# Patient Record
Sex: Female | Born: 1980 | Race: White | Hispanic: No | Marital: Married | Smoking: Never smoker
Health system: Southern US, Community
[De-identification: ages and names within clinical notes are randomized; demographics above are authoritative.]

## PROBLEM LIST (undated history)

## (undated) DIAGNOSIS — G43909 Migraine, unspecified, not intractable, without status migrainosus: Secondary | ICD-10-CM

## (undated) DIAGNOSIS — I10 Essential (primary) hypertension: Secondary | ICD-10-CM

## (undated) DIAGNOSIS — F419 Anxiety disorder, unspecified: Secondary | ICD-10-CM

## (undated) DIAGNOSIS — Z8661 Personal history of infections of the central nervous system: Secondary | ICD-10-CM

## (undated) DIAGNOSIS — F329 Major depressive disorder, single episode, unspecified: Secondary | ICD-10-CM

## (undated) DIAGNOSIS — N879 Dysplasia of cervix uteri, unspecified: Secondary | ICD-10-CM

## (undated) DIAGNOSIS — F32A Depression, unspecified: Secondary | ICD-10-CM

## (undated) DIAGNOSIS — J111 Influenza due to unidentified influenza virus with other respiratory manifestations: Secondary | ICD-10-CM

## (undated) DIAGNOSIS — R87629 Unspecified abnormal cytological findings in specimens from vagina: Secondary | ICD-10-CM

## (undated) HISTORY — DX: Dysplasia of cervix uteri, unspecified: N87.9

## (undated) HISTORY — DX: Depression, unspecified: F32.A

## (undated) HISTORY — PX: TONSILLECTOMY: SHX5217

## (undated) HISTORY — DX: Personal history of infections of the central nervous system: Z86.61

## (undated) HISTORY — DX: Influenza due to unidentified influenza virus with other respiratory manifestations: J11.1

## (undated) HISTORY — DX: Migraine, unspecified, not intractable, without status migrainosus: G43.909

## (undated) HISTORY — DX: Anxiety disorder, unspecified: F41.9

## (undated) HISTORY — DX: Unspecified abnormal cytological findings in specimens from vagina: R87.629

## (undated) HISTORY — PX: VARICOSE VEIN SURGERY: SHX832

## (undated) HISTORY — DX: Major depressive disorder, single episode, unspecified: F32.9

## (undated) HISTORY — DX: Essential (primary) hypertension: I10

---

## 2002-07-03 ENCOUNTER — Other Ambulatory Visit: Admission: RE | Admit: 2002-07-03 | Discharge: 2002-07-03 | Payer: Self-pay | Admitting: Obstetrics and Gynecology

## 2005-10-27 ENCOUNTER — Emergency Department: Payer: Self-pay | Admitting: Emergency Medicine

## 2005-11-03 ENCOUNTER — Ambulatory Visit: Payer: Self-pay | Admitting: Family Medicine

## 2007-02-25 IMAGING — CT CT HEAD WITHOUT CONTRAST
2 series · 16 of 30 positions shown, 20 images · non-contrast
Comparison: none

REASON FOR EXAM: Hypertension
COMMENTS:

[Series 2: without · axial · non-contrast · 0.40mm/px · z∈[-180,-56]mm · 13 of 31 slices shown, 17 images]
[im 3/31  brain]
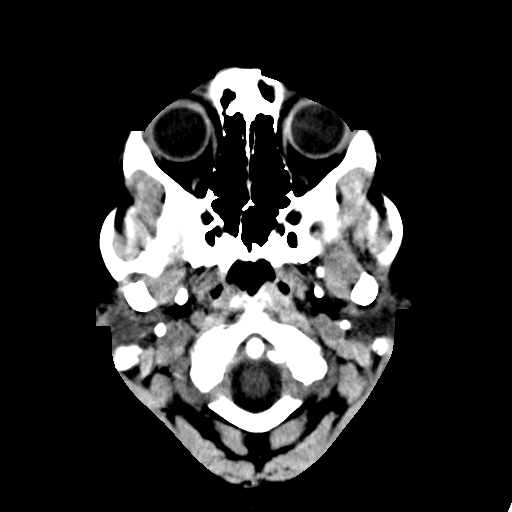
[im 3/31  bone]
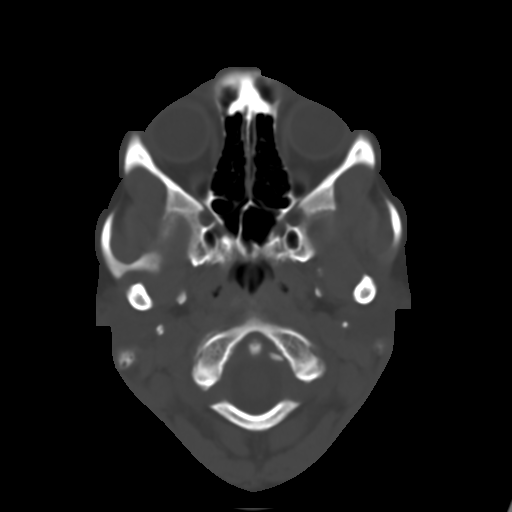
[im 5/31  brain]
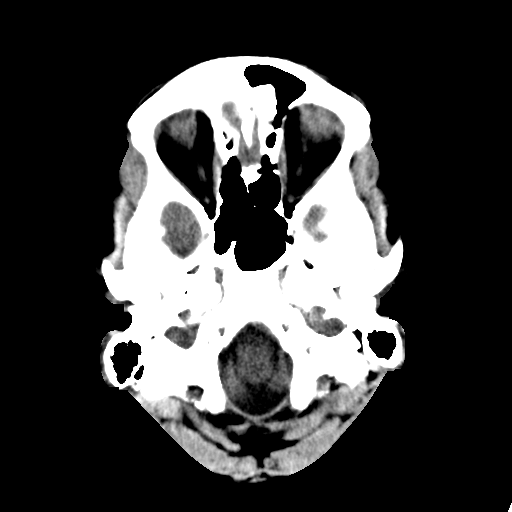
[im 7/31  brain]
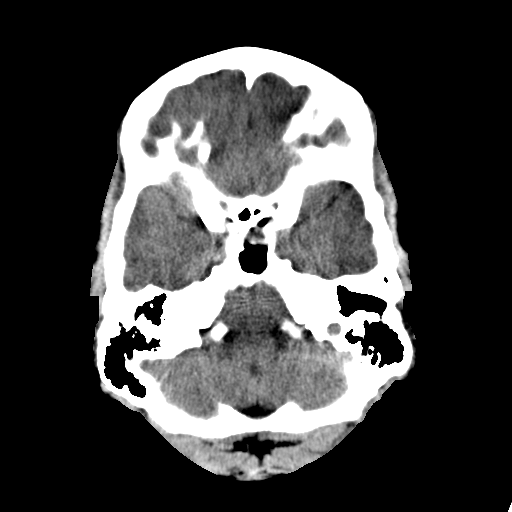
[im 9/31  brain]
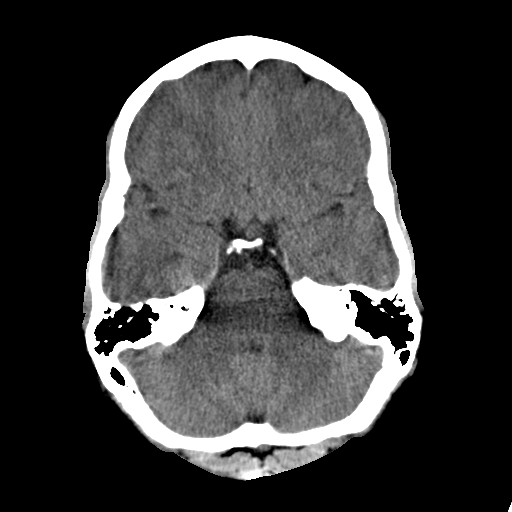
[im 11/31  brain]
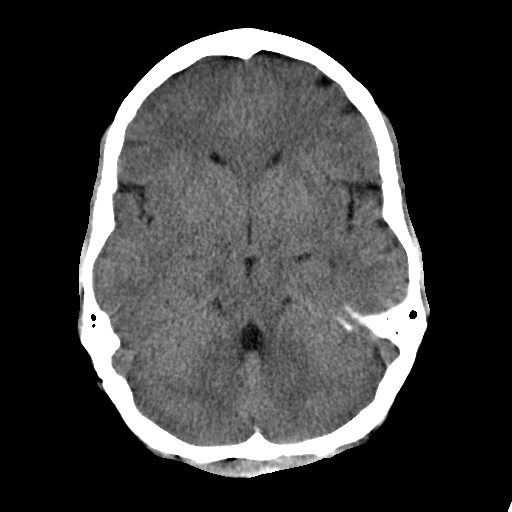
[im 11/31  bone]
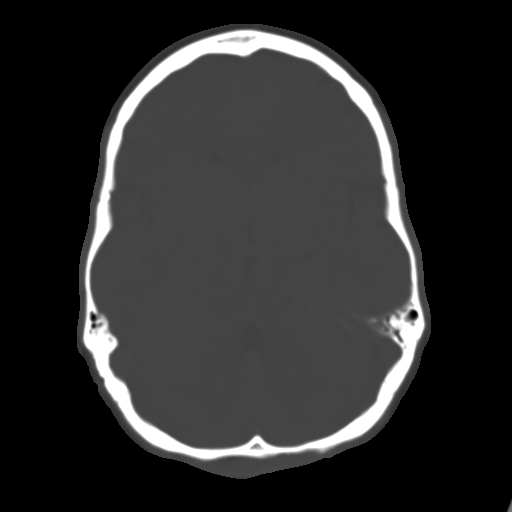
[im 13/31  brain]
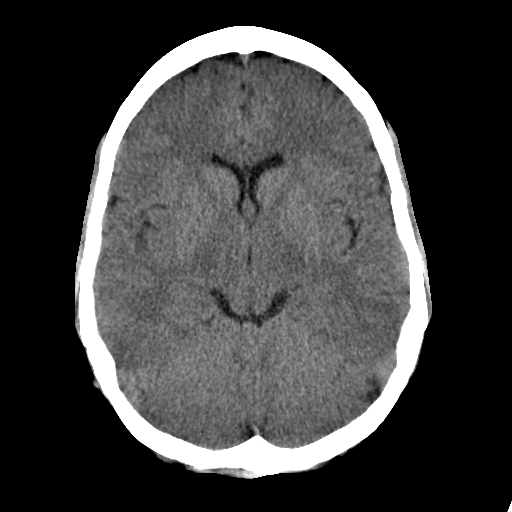
[im 16/31  brain]
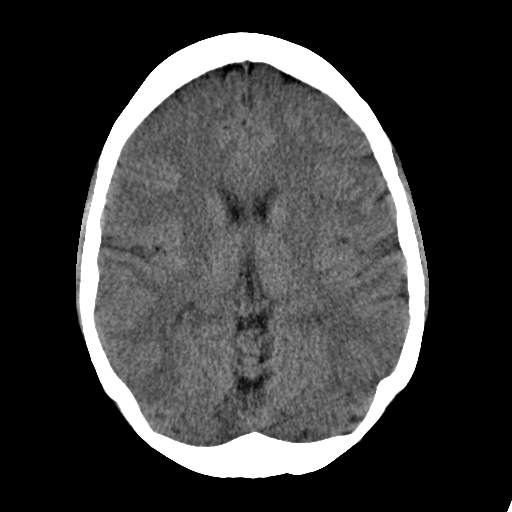
[im 18/31  brain]
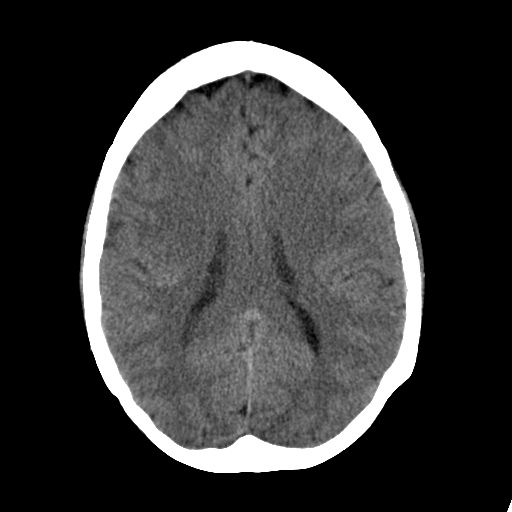
[im 20/31  brain]
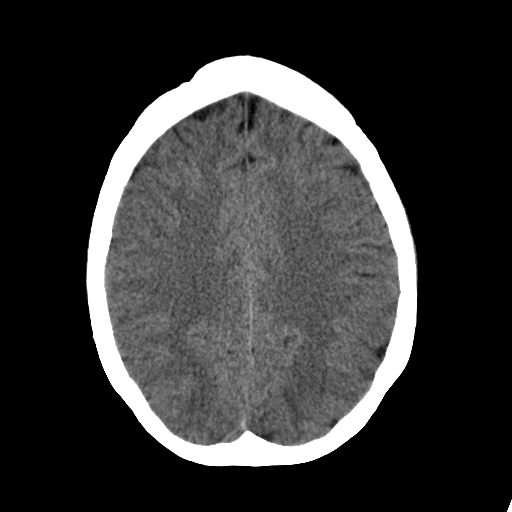
[im 20/31  bone]
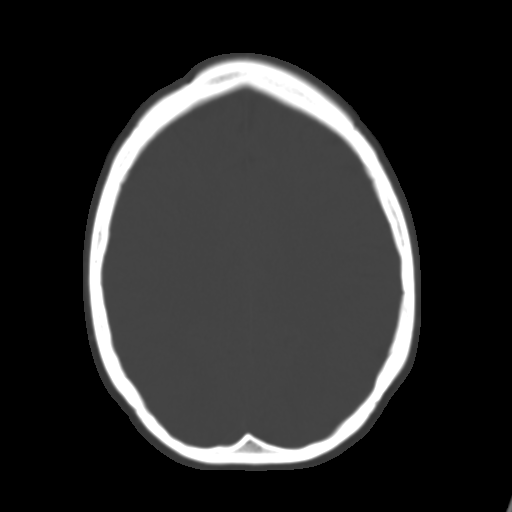
[im 22/31  brain]
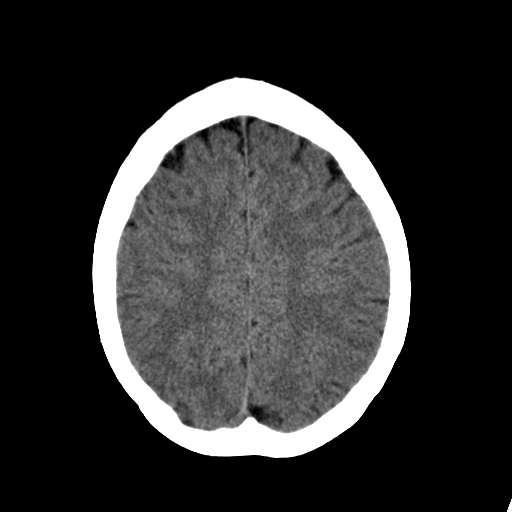
[im 24/31  brain]
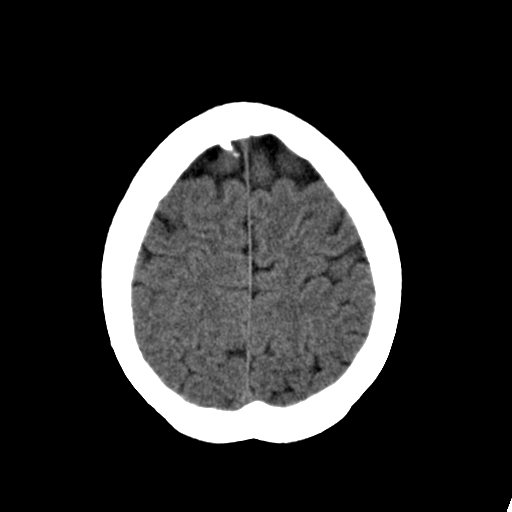
[im 26/31  brain]
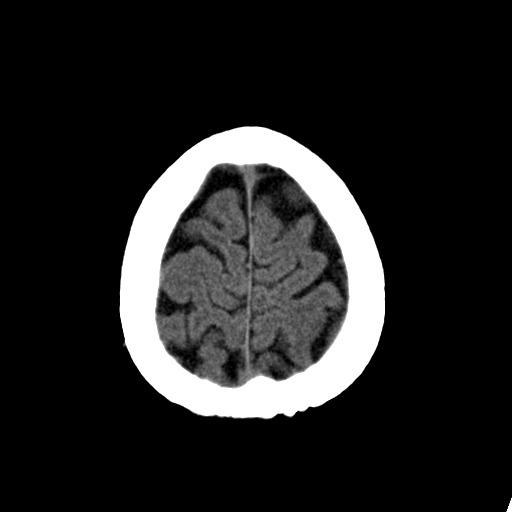
[im 28/31  brain]
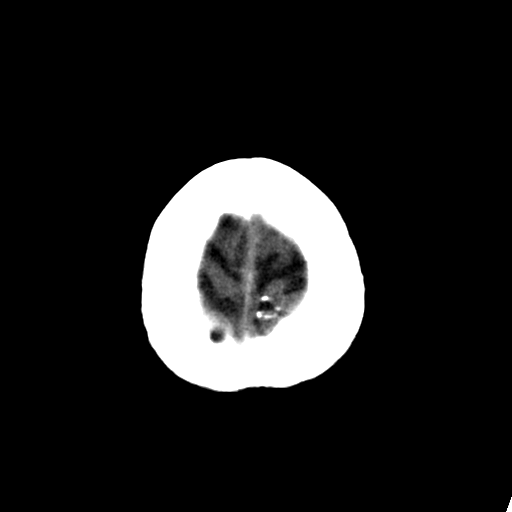
[im 28/31  bone]
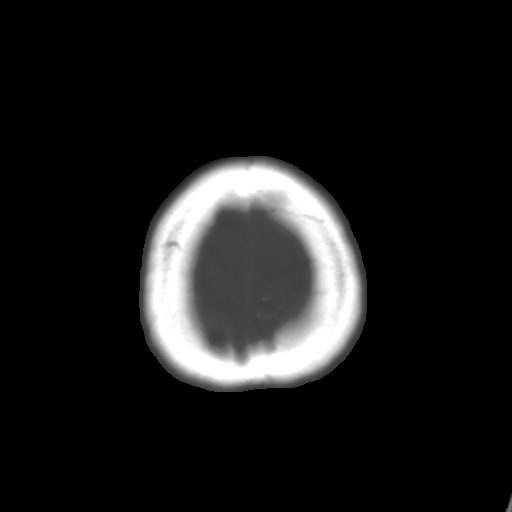

[Series 3: bone · axial · 0.40mm/px · z∈[-180,-140]mm · 3 of 31 slices shown]
[im 3/31  bone]
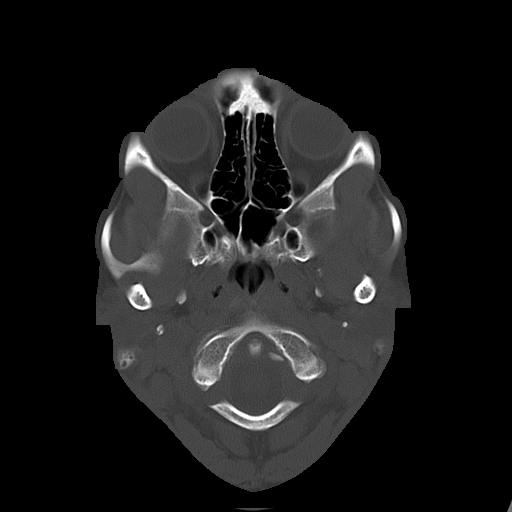
[im 7/31  bone]
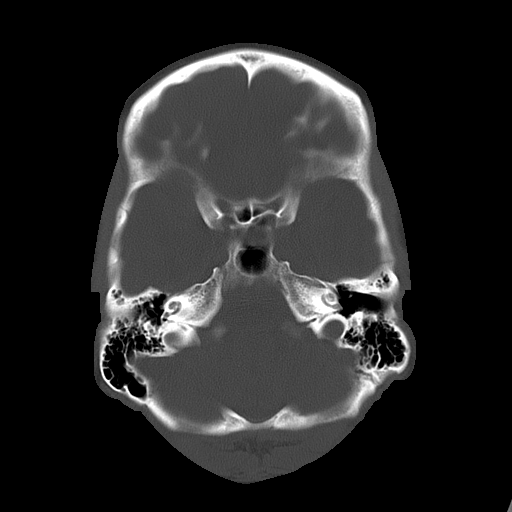
[im 11/31  bone]
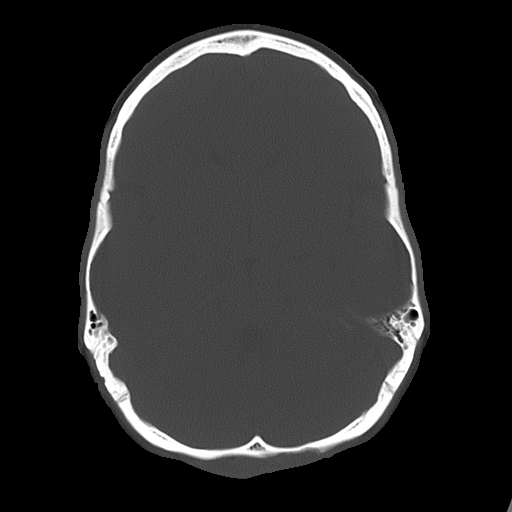

[16 of 30 positions shown; findings below may reference images not displayed]

PROCEDURE:     CT  - CT HEAD WITHOUT CONTRAST  - October 27, 2005 [DATE]

RESULT:          Noncontrast emergent CT of the brain demonstrates a normal
appearance of the ventricles and sulci.  There is no evidence of hemorrhage.
 There is no mass effect or midline shift.  There is no extraaxial
hemorrhage.  Bone windows show no skull fracture.  The visualized paranasal
sinuses and mastoid air cells appear to be normally aerated.
IMPRESSION: No CT evidence of an acute intracranial abnormality.

## 2009-11-23 DIAGNOSIS — F341 Dysthymic disorder: Secondary | ICD-10-CM

## 2009-11-23 DIAGNOSIS — F411 Generalized anxiety disorder: Secondary | ICD-10-CM | POA: Insufficient documentation

## 2009-11-23 DIAGNOSIS — I10 Essential (primary) hypertension: Secondary | ICD-10-CM

## 2009-12-09 ENCOUNTER — Encounter: Payer: Self-pay | Admitting: Cardiovascular Disease

## 2009-12-09 ENCOUNTER — Ambulatory Visit: Payer: Self-pay

## 2009-12-09 DIAGNOSIS — R002 Palpitations: Secondary | ICD-10-CM | POA: Insufficient documentation

## 2010-10-11 NOTE — Miscellaneous (Signed)
Summary: Orders Update  Clinical Lists Changes  Problems: Added new problem of PALPITATIONS (ICD-785.1) Orders: Added new Referral order of Echocardiogram ([facility]) - Signed

## 2011-04-05 ENCOUNTER — Encounter: Payer: Self-pay | Admitting: Cardiovascular Disease

## 2014-02-23 LAB — HM PAP SMEAR: HM Pap smear: NEGATIVE

## 2014-07-21 ENCOUNTER — Encounter: Payer: Self-pay | Admitting: Podiatry

## 2014-07-21 ENCOUNTER — Ambulatory Visit (INDEPENDENT_AMBULATORY_CARE_PROVIDER_SITE_OTHER): Payer: BC Managed Care – PPO | Admitting: Podiatry

## 2014-07-21 ENCOUNTER — Ambulatory Visit (INDEPENDENT_AMBULATORY_CARE_PROVIDER_SITE_OTHER): Payer: BC Managed Care – PPO

## 2014-07-21 VITALS — BP 135/86 | HR 80 | Resp 16 | Ht 66.0 in | Wt 150.0 lb

## 2014-07-21 DIAGNOSIS — S92301A Fracture of unspecified metatarsal bone(s), right foot, initial encounter for closed fracture: Secondary | ICD-10-CM

## 2014-07-21 DIAGNOSIS — M779 Enthesopathy, unspecified: Secondary | ICD-10-CM

## 2014-07-21 NOTE — Patient Instructions (Signed)
Stress Fracture When too much stress is put on the foot, as may occur in running and jumping sports, the lengthy shafts of the bones of the forefoot become susceptible to breaking due to repetitive stress (stress fracture) because of thinness of these bone. A stress fracture is more common if osteoporosis is present or if inadequate athletic footwear is used. Shoes should be used which adequately support the sole of the foot to absorb the shocks of the activity participated in. Stress fractures are very common in competitive female runners who develop these small cracks on the surface of the bones in their legs and feet. The women most likely to suffer these injuries are those who restrict food intake and those who have irregular periods. Stress fractures usually start out as a minor discomfort in the foot or leg. The completion of fracture due to repetitive loading often occurs near the end of a long run. The pain may dissipate with rest. With the next exercise session, the pain may return earlier in the run. If an athlete notices that it hurts to touch just one spot on a bone and then stops running for a week, he or she may be tempted to return to running too soon. Often the pain is ignored in order to continue with high impact exercise. A stress fracture then develops. The athlete now has to avoid the hard pounding of running, but can ride a bike or swim for exercise once the pain has resolved with normal weight bearing until the fracture heals in 6-12 weeks. The most common sites for stress fractures are the bones in the front of the feet (metatarsals) and the long bone of the lower leg (tibia), but running can cause stress fractures anywhere in the lower extremities or pelvis. DIAGNOSIS  Usually the diagnosis is made by reviewing the patient's history. The bone involved progressively becomes more painful with activities. X-rays may show no break within the first 2-3 weeks that pain begins. A later X-ray may  show signs that the bone is healing. Having a bone scan or MRI usually makes an earlier diagnosis possible. HOME CARE INSTRUCTIONS  Treatment may include a cast or walking shoe.  High impact activities should be stopped until advised by your caregiver.  Wear shoes with adequate shock absorbing abilities and good support of the sole of the foot. This is especially important in the arch of the foot.  Alternative exercise may be undertaken while waiting for healing. This may include bicycling and swimming. If you do not have a cast or splint:  You may walk on your injured foot as tolerated or advised.  Do not put any weight on your injured foot until instructed. Slowly increase the amount of time you walk on the foot as the pain allows or as advised.  Use crutches until you can bear weight without pain. A gradual increase in weight bearing may help.  Apply ice to the injured area for the first 2 days after you have been treated or as directed by your caregiver.  Put ice in a plastic bag.  Place a towel between your skin and the bag.  Leave the ice on for 15-20 minutes at a time, every hour while you are awake.  Only take over-the-counter or prescription medicines for pain or discomfort as directed by your caregiver.  If your caregiver has given you a follow-up appointment, it is very important to keep that appointment. Not keeping the appointment could result in a chronic or permanent   injury, pain, and disability. SEEK IMMEDIATE MEDICAL CARE IF:   Pain is becoming worse rather than better.  Pain is uncontrolled with medicine.  You have increased swelling or redness in the foot.  The feeling in the foot or leg is diminished. MAKE SURE YOU:   Understand these instructions.  Will watch your condition.  Will get help right away if you are not doing well or get worse. Document Released: 11/18/2002 Document Revised: 12/23/2012 Document Reviewed: 04/13/2008 ExitCare Patient  Information 2015 [employer]. This information is not intended to replace advice given to you by your health care provider. Make sure you discuss any questions you have with your health care provider.  

## 2014-07-21 NOTE — Progress Notes (Signed)
   Subjective:    Patient ID: Rebecca Orozco, female    DOB: 09/16/1980, 33 y.o.   MRN: [id number]  HPI Comments: Ms. Rebecca Orozco, 33 year old female, presents the office today with complaints of right foot pain. She states that she has severe pain on the top of her right foot, which has been ongoing for 3.5 weeks. She presents to Dr. Hyacinth MeekerMiller with orthopedics and partially 2 weeks ago who prescribed him over. She states that she has been icing as well as soaking in Epson salts without much resolution. She states that she has pain particularly with weightbearing and ambulation. She denies any specific injury or trauma to the area. She denies any other complaints at this time.   Foot Pain      Review of Systems  Musculoskeletal:       Joint pain  All other systems reviewed and are negative.       Objective:   Physical Exam AAO x3, NAD DP/PT pulses palpable bilaterally, CRT less than 3 seconds Protective sensation intact with Simms Weinstein monofilament, vibratory sensation intact, Achilles tendon reflex intact Tenderness to palpation over the right second metatarsal. There is overlying edema. There is no overlying erythema, increased warmth. There is no other areas of tenderness palpation in the right foot. There is no pain to the left foot. MMT decreased on the right secondary to pain.  No open lesions. No calf pain, swelling, warmth, erythema.      Assessment & Plan:  33 year old female with right second metatarsal fracture. -X-rays were obtained and reviewed with the patient. There is a fracture of the second metatarsal most notably seen on the oblique view. -Treatment options discussed including alternatives, risks, competitions. -Discussed with her immobilization in a CAM boot. She states that she has a boot at home from prior injury. Discussed with her to wear this at all times while weightbearing however she cannot drive with the boot. Also discussed with her risks and, occasions of  immobilization including, but not limited to, DVT. If any are to occur to go to the emergency room. -Ice to the area. -Follow-up in 2 weeks for repeat x-rays or sooner if any problems are to arise. In the meantime call the office with any questions, concerns.

## 2014-07-22 DIAGNOSIS — S92309A Fracture of unspecified metatarsal bone(s), unspecified foot, initial encounter for closed fracture: Secondary | ICD-10-CM | POA: Insufficient documentation

## 2014-08-11 ENCOUNTER — Ambulatory Visit (INDEPENDENT_AMBULATORY_CARE_PROVIDER_SITE_OTHER): Payer: BC Managed Care – PPO | Admitting: Podiatry

## 2014-08-11 ENCOUNTER — Ambulatory Visit (INDEPENDENT_AMBULATORY_CARE_PROVIDER_SITE_OTHER): Payer: BC Managed Care – PPO

## 2014-08-11 VITALS — BP 132/86 | HR 81 | Resp 16

## 2014-08-11 DIAGNOSIS — S92301A Fracture of unspecified metatarsal bone(s), right foot, initial encounter for closed fracture: Secondary | ICD-10-CM

## 2014-08-11 DIAGNOSIS — S92301D Fracture of unspecified metatarsal bone(s), right foot, subsequent encounter for fracture with routine healing: Secondary | ICD-10-CM

## 2014-08-11 NOTE — Patient Instructions (Signed)
Metatarsal Stress Fracture A stress fracture is a break in a bone of the body that is caused by repeated stress (trauma) that slowly weakens the bone until it eventually breaks. The metatarsal bones are in the middle of the feet, connecting the toes to the ankle. They are vulnerable to stress fractures. Metatarsal stress fractures are the second most common type of stress fracture in athletes. The metatarsal of the pointer toe (second metatarsal) is the most common metatarsal to suffer a stress fracture. SYMPTOMS   Vague, spread out pain or ache. Sometimes, tenderness and swelling in the foot.  Uncommonly, bleeding and bruising in the foot.  Weakness and inability to bear weight on the injured foot.  Paleness and deformity (sometimes). CAUSES  A stress fracture is caused by repeated trauma. This slowly weakens the bone, faster than it can heal itself, until the bone breaks. Stress fractures often follow a sudden change in training schedule. Stress fractures may be related to the loss of menstrual period in women.  RISK INCREASES WITH:   Previous stress fracture.  Sudden changes in training intensity, frequency, or duration (military recruits, distance runners).  Bony abnormalities (osteoporosis, tumors).  Metabolism disorders or hormone problems.  Nutrition deficiencies or eating disorders (anorexia or bulimia).  The loss of or irregular menstrual periods in women.  Poor strength and flexibility.  Running on hard surfaces. Poor leg and foot alignment. This includes flat feet.  Poor footwear with poor shock absorbers.  Poor running technique. PREVENTION   Warm up and stretch properly before activity.  Maintain physical fitness:  Muscle strength.  Endurance and flexibility.  Wear proper and correctly fitted footwear. Replace shoes after 300 to 500 miles of running.  Learn and use proper technique with training and activity.  Increase activity and training  gradually.  Treat hormonal disorders. Birth control pills can be helpful for women with menstrual period irregularity.  Correct metabolism and nutrition disorders.  Wear cushioned arch supports for runners with flat feet. PROGNOSIS  With proper treatment, stress fractures usually heal within 6 to 12 weeks. RELATED COMPLICATIONS   Failure to heal (nonunion), especially with stress fractures of the outer foot (upper part of the fifth metatarsal).  Healing in a poor position (malunion).  Recurring stress fracture.  Progression to a complete or displaced fracture.  Risks of surgery: infection, bleeding, injury to nerves (numbness, weakness, paralysis), and need for further surgery.  Repeated stress fracture, not necessarily at the same site. (Occurs in 1 of every 10 patients). TREATMENT Treatment first involves ice and medicine to reduce pain and inflammation. You must rest from any aggravating activity, to avoid making the fracture worse. For severe stress fractures, crutches may be advised, to take weight off the injured foot. Depending on your caregiver's instructions, you may be permitted to perform activities that do not cause pain. Any menstrual, hormonal, or nutritional problems must be addressed and treated. Return to activity must be performed gradually, to avoid reinjuring the foot. Physical therapy may be advised, to help strengthen the foot and regain full function. On rare occasions, surgery is needed. This may be offered if non-surgical treatment is ineffective after 3 to 6 months. MEDICATION   If pain medicine is needed, nonsteroidal anti-inflammatory medicines (NSAIDS) or other minor pain relievers are often advised.  Do not take pain medicine for 7 days before surgery.  Only take over-the-counter or prescription medicines for pain, discomfort, or fever as directed by your caregiver. SEEK IMMEDIATE MEDICAL CARE IF:  Symptoms get   worse or do not improve in 2 weeks, despite  treatment. The following occur after immobilization or surgery:  Swelling above or below the fracture site.  Severe, persistent pain.  Blue or gray skin below the fracture site, especially under the toenails. Numbness or loss of feeling below the fracture site.  New, unexplained symptoms develop. (Drugs used in treatment may produce side effects.) Document Released: 08/28/2005 Document Revised: 11/20/2011 Document Reviewed: 12/10/2008 ExitCare Patient Information 2015 [employer]. This information is not intended to replace advice given to you by your health care provider. Make sure you discuss any questions you have with your health care provider.  

## 2014-08-11 NOTE — Progress Notes (Signed)
Patient ID: Sw[place]il[place] Pacitti, female   DOB: 1981-05-01, 33 y.o.   MRN: [id number]  Subjective: 33 year old female returns the office they for follow-up evaluation stress fracture right second metatarsal. She states that she no longer has the burning pain that she had his morbid total achy pain. She's been continuing the CAM walker. Denies any acute changes since last. No other complaints at this time.  Objective: AAO x3, NAD DP/PT pulses palpable bilaterally, CRT less than 3 seconds Protective sensation intact with Simms Weinstein monofilament, vibratory sensation intact, Achilles tendon reflex intact Mild discomfort upon palpation of the right second metatarsal. There is no significant overlying edema. No overlying erythema or increase in warmth. No other areas of pinpoint bony tenderness or pain with vibratory sensation. No pain with calf compression, swelling, warmth, erythema.  Assessment: 33 year old female right second metatarsal fracture  Plan: -X-rays were obtained and reviewed with the patient. There is evidence of periosteal reaction on the second metatarsal -Treatment options were discussed including alternatives, risks, complications. -At this time recommend continue immobilization in a Cam Walker. Continue ice and elevation. Dispensed anklet to help control swelling if needed. -Also discussed the patient to follow-up with her primary care physician to rule out other causes of a stress fracture including vitamin deficiencies. She states that she previously had low potassium. -Follow-up in 3 weeks for repeat x-rays. In the meantime, call the office with any questions, concerns, change in symptoms.

## 2014-09-01 ENCOUNTER — Ambulatory Visit (INDEPENDENT_AMBULATORY_CARE_PROVIDER_SITE_OTHER): Payer: BC Managed Care – PPO

## 2014-09-01 ENCOUNTER — Ambulatory Visit (INDEPENDENT_AMBULATORY_CARE_PROVIDER_SITE_OTHER): Payer: BC Managed Care – PPO | Admitting: Podiatry

## 2014-09-01 ENCOUNTER — Encounter: Payer: Self-pay | Admitting: Podiatry

## 2014-09-01 VITALS — BP 123/97 | HR 70 | Resp 18

## 2014-09-01 DIAGNOSIS — S92301A Fracture of unspecified metatarsal bone(s), right foot, initial encounter for closed fracture: Secondary | ICD-10-CM

## 2014-09-01 DIAGNOSIS — S92301D Fracture of unspecified metatarsal bone(s), right foot, subsequent encounter for fracture with routine healing: Secondary | ICD-10-CM

## 2014-09-02 NOTE — Progress Notes (Signed)
Patient ID: Rebecca Orozco, female   DOB: 10-24-1980, 33 y.o.   MRN: [id number]  Subjective: 33 year old female returns the office today for follow up with violation a right second metatarsal stress fracture. She states that she's continue to wear the CAM walker and she has had decreasing pain and swelling. Denies any other complaints at this time. No acute changes and last appointment. Denies any systemic complaints such as fevers, chills, nausea, vomiting. No chest pain, shortest of breath, calf pain.  Objective: AAO 3, NAD DP/PT pulses palpable bilaterally, CRT less than 3 seconds Protective sensation intact with Simms Weinstein monofilament, vibratory sensation intact, Achilles tendon reflex intact. There is mild discomfort upon palpation overlying the second metatarsal the site of the fracture. There is trace edema overlying the area. There is no increase in warmth or erythema. There is no other areas of pinpoint bony tenderness or pain with vibratory sensation. No pain with calf compression, swelling, warmth, erythema. No open lesions or pre-ulcerative lesions.  Assessment: 33 year old female follow-up evaluation right second metatarsal stress fracture  Plan: -X-rays were obtained and reviewed with the patient. -Treatment options were discussed including alternatives, risks, complications. -At this time she's been immobilized for probably 6 weeks. There is evidence of healing on x-ray although there is still slightly radiolucent line consistent with a fracture which is not completely healed yet. Because of the x-ray findings recommend continue mobilization in a CAM walker for 2 more weeks. At that time new x-rays will be performed. Pending x-ray will likely transition into a sneaker.  -Follow-up in 2 weeks for repeat x-rays or sooner should any palms arise. In the meantime, call the office with any questions, concerns, change in symptoms

## 2014-09-15 ENCOUNTER — Ambulatory Visit (INDEPENDENT_AMBULATORY_CARE_PROVIDER_SITE_OTHER): Payer: BC Managed Care – PPO

## 2014-09-15 ENCOUNTER — Ambulatory Visit (INDEPENDENT_AMBULATORY_CARE_PROVIDER_SITE_OTHER): Payer: BC Managed Care – PPO | Admitting: Podiatry

## 2014-09-15 VITALS — BP 145/93 | HR 82 | Resp 16

## 2014-09-15 DIAGNOSIS — S92301D Fracture of unspecified metatarsal bone(s), right foot, subsequent encounter for fracture with routine healing: Secondary | ICD-10-CM

## 2014-09-16 NOTE — Progress Notes (Signed)
Patient ID: Swazil[place]Jordan Littlefield, female   DOB: 02-Jul-1981, 34 y.o.   MRN: [id number]  Subjective:  34 year old female returns the office for follow-up evaluation of right second metatarsal stress fracture. She states that over the last couple weeks she has been off of work she does not have any discomfort to the area. Yesterday, she did return to work and she's had some mild intermittent discomfort over the area. She denies any increase in swelling. No recent injury or trauma. Denies any systemic complaints such as fevers, chills, nausea, vomiting. No acute changes since last appointment and no other complaints at this time.  Objective: AAO x3, NAD DP/PT pulses palpable bilaterally, CRT less than 3 seconds Protective sensation intact with Simms Weinstein monofilament, vibratory sensation intact, Achilles tendon reflex intact There is currently no tenderness to palpation overlying the site of the fracture and is no pain with vibratory sensation. There is no other areas of pinpoint bony tenderness/pain with vibratory sensation. No overlying edema, erythema, increased warmth to bilateral lower extremity. MMT 5/5, ROM WNL No open lesions or pre-ulcerative lesions. No pain with calf compression, swelling, warmth, erythema.  Assessment: 34 year old female with healing right second metatarsal stress fracture; 8 weeks  Plan: -X-rays were obtained and reviewed with the patient. -At this time as the patient has had significant improvement in symptoms and there is evidence of increased consolidation x-ray can start to slowly transition into his shoe. Recommend continuing the CAM walker while walking for periods of time or working however she can start to slowly transition into a regular sneaker at home and for short distances if she has no difficulties. Discussed with her that if she has any increase in pain while transitioning into his shoe to remain in the CAM walker. -Follow-up in 3 weeks for repeat x-rays or  sooner should any problems occur or any change/increase in symptoms. In the meantime, encouraged to call the office with any questions, concerns, change in symptoms.

## 2014-10-06 ENCOUNTER — Ambulatory Visit: Payer: BC Managed Care – PPO | Admitting: Podiatry

## 2014-10-13 ENCOUNTER — Ambulatory Visit: Payer: BC Managed Care – PPO | Admitting: Podiatry

## 2015-01-05 DIAGNOSIS — N879 Dysplasia of cervix uteri, unspecified: Secondary | ICD-10-CM

## 2015-01-05 DIAGNOSIS — G4700 Insomnia, unspecified: Secondary | ICD-10-CM | POA: Insufficient documentation

## 2015-02-25 ENCOUNTER — Other Ambulatory Visit: Payer: Self-pay | Admitting: *Deleted

## 2015-02-25 ENCOUNTER — Ambulatory Visit (INDEPENDENT_AMBULATORY_CARE_PROVIDER_SITE_OTHER): Payer: BC Managed Care – PPO | Admitting: *Deleted

## 2015-02-25 VITALS — BP 128/84 | HR 88 | Ht 65.6 in | Wt 147.9 lb

## 2015-02-25 DIAGNOSIS — K644 Residual hemorrhoidal skin tags: Secondary | ICD-10-CM

## 2015-02-25 DIAGNOSIS — E669 Obesity, unspecified: Secondary | ICD-10-CM

## 2015-02-25 MED ORDER — CYANOCOBALAMIN 1000 MCG/ML IJ SOLN
1000.0000 ug | Freq: Once | INTRAMUSCULAR | Status: AC
  Administered 2015-02-25: 1000 ug via INTRAMUSCULAR

## 2015-02-26 NOTE — Telephone Encounter (Signed)
Pt is c/o external hemorrhoid, is leaving for the beach on 02/28/2015, would like something sent in for relief

## 2015-03-04 MED ORDER — LIDOCAINE (ANORECTAL) 5 % EX CREA: TOPICAL_CREAM | CUTANEOUS | Status: DC

## 2015-07-23 ENCOUNTER — Ambulatory Visit (INDEPENDENT_AMBULATORY_CARE_PROVIDER_SITE_OTHER): Payer: BC Managed Care – PPO | Admitting: Obstetrics and Gynecology

## 2015-07-23 ENCOUNTER — Encounter: Payer: Self-pay | Admitting: Obstetrics and Gynecology

## 2015-07-23 VITALS — BP 122/92 | HR 100 | Ht 66.0 in | Wt 153.2 lb

## 2015-07-23 DIAGNOSIS — Z01419 Encounter for gynecological examination (general) (routine) without abnormal findings: Secondary | ICD-10-CM | POA: Diagnosis not present

## 2015-07-23 NOTE — Progress Notes (Signed)
  [id number]:     Rebecca Orozco is a 34 y.o. female and is here for a comprehensive physical exam. The patient reports no problems.  Social History   Social History  . Marital Status: Divorced    Spouse Name: N/A  . Number of Children: N/A  . Years of Education: N/A   Occupational History  . Not on file.   Social History Main Topics  . Smoking status: Never Smoker   . Smokeless tobacco: Never Used     Comment: tobacco use - no  . Alcohol Use: 0.0 oz/week    0 Standard drinks or equivalent per week  . Drug Use: No  . Sexual Activity: Yes    Birth Control/ Protection: IUD     Comment: mirena   Other Topics Concern  . Not on file   Social History Narrative   Full time teacher at [employer]; gets regular exercise.    Health Maintenance  Topic Date Due  . HIV Screening  05/26/1995  . TETANUS/TDAP  05/26/1999  . PAP SMEAR  05/25/2001  . INFLUENZA VACCINE  04/12/2015    The following portions of the patient's history were reviewed and updated as appropriate: allergies, current medications, past family history, past medical history, past social history, past surgical history and problem list.  Review of Systems A comprehensive review of systems was negative.   Objective:    General appearance: alert, cooperative and appears stated age Neck: no adenopathy, no carotid bruit, no JVD, supple, symmetrical, trachea midline and thyroid not enlarged, symmetric, no tenderness/mass/nodules Lungs: clear to auscultation bilaterally Breasts: normal appearance, no masses or tenderness Heart: regular rate and rhythm, S1, S2 normal, no murmur, click, rub or gallop Pelvic: cervix normal in appearance, external genitalia normal, no adnexal masses or tenderness, no cervical motion tenderness, rectovaginal septum normal, uterus normal size, shape, and consistency and vagina normal without discharge    Assessment:    Healthy female exam. IUD surviellence; h/o abnormal pap      Plan:  Pap obtained.   See After Visit Summary for Counseling Recommendations

## 2015-07-23 NOTE — Patient Instructions (Signed)
  Place annual gynecologic exam patient instructions here.  Thank you for enrolling in MyChart. Please follow the instructions below to securely access your online medical record. MyChart allows you to send messages to your doctor, view your test results, manage appointments, and more.   How Do I Sign Up? 1. In your Internet browser, go to [employer] and enter [email or web address]. 2. Click on the Sign Up Now link in the Sign In box. You will see the New Member Sign Up page. 3. Enter your MyChart Access Code exactly as it appears below. You will not need to use this code after you've completed the sign-up process. If you do not sign up before the expiration date, you must request a new code.  MyChart Access Code: [email or web address] Expires: 08/08/2015  3:42 PM  4. Enter your Social Security Number ([id number]) and Date of Birth (mm/dd/yyyy) as indicated and click Submit. You will be taken to the next sign-up page. 5. Create a MyChart ID. This will be your MyChart login ID and cannot be changed, so think of one that is secure and easy to remember. 6. Create a MyChart password. You can change your password at any time. 7. Enter your Password Reset Question and Answer. This can be used at a later time if you forget your password.  8. Enter your e-mail address. You will receive e-mail notification when new information is available in MyChart. 9. Click Sign Up. You can now view your medical record.   Additional Information Remember, MyChart is NOT to be used for urgent needs. For medical emergencies, dial 911.

## 2015-07-27 ENCOUNTER — Telehealth: Payer: Self-pay | Admitting: Obstetrics and Gynecology

## 2015-07-27 ENCOUNTER — Other Ambulatory Visit: Payer: Self-pay | Admitting: Obstetrics and Gynecology

## 2015-07-27 MED ORDER — CEFDINIR 300 MG PO CAPS: 300.0000 mg | ORAL_CAPSULE | Freq: Two times a day (BID) | ORAL | Status: DC

## 2015-07-27 NOTE — Telephone Encounter (Signed)
Patient called stating Melody told her if she did not feel better by today to call in and she would send in an antibiotic for her. She uses [facility]

## 2015-07-27 NOTE — Telephone Encounter (Signed)
Please let her know I sent in RX

## 2015-07-27 NOTE — Telephone Encounter (Signed)
Pls advise.  

## 2015-07-27 NOTE — Telephone Encounter (Signed)
Done-ac 

## 2016-01-27 ENCOUNTER — Ambulatory Visit: Payer: BC Managed Care – PPO | Admitting: Obstetrics and Gynecology

## 2016-06-01 ENCOUNTER — Ambulatory Visit: Payer: BC Managed Care – PPO | Admitting: Obstetrics and Gynecology

## 2016-06-02 ENCOUNTER — Ambulatory Visit (INDEPENDENT_AMBULATORY_CARE_PROVIDER_SITE_OTHER): Payer: BC Managed Care – PPO | Admitting: Obstetrics and Gynecology

## 2016-06-02 ENCOUNTER — Encounter: Payer: Self-pay | Admitting: Obstetrics and Gynecology

## 2016-06-02 VITALS — BP 123/84 | HR 82 | Ht 66.0 in | Wt 174.5 lb

## 2016-06-02 DIAGNOSIS — E663 Overweight: Secondary | ICD-10-CM

## 2016-06-02 MED ORDER — CYANOCOBALAMIN 1000 MCG/ML IJ SOLN: 1000.0000 ug | INTRAMUSCULAR | 1 refills | Status: DC

## 2016-06-02 MED ORDER — PHENTERMINE HCL 37.5 MG PO TABS: 37.5000 mg | ORAL_TABLET | Freq: Every day | ORAL | 2 refills | Status: DC

## 2016-06-02 NOTE — Progress Notes (Signed)
Subjective:  Rebecca Orozco is a 35 y.o. No obstetric history on file. at Unknown being seen today for weight loss management- initial visit.  Patient reports General ROS: negative and reports previous weight loss attempts:   The following portions of the patient's history were reviewed and updated as appropriate: allergies, current medications, past family history, past medical history, past social history, past surgical history and problem list.   Objective:   Vitals:   06/02/16 1040  BP: 123/84  Pulse: 82  Weight: 174 lb 8 oz (79.2 kg)  Height: 5\' 6"  (1.676 m)    General:  Alert, oriented and cooperative. Patient is in no acute distress.  :   :   :   :   :   :   PE: Well groomed female in no current distress,   Mental Status: Normal mood and affect. Normal behavior. Normal judgment and thought content.   Current BMI: Body mass index is 28.17 kg/m.   Assessment and Plan:  Obesity  There are no diagnoses linked to this encounter.  Plan: low carb, High protein diet RX for adipex 37.5 mg daily and B12 1000mcg.ml monthly, to start now with first injection given at today's visit. Reviewed side-effects common to both medications and expected outcomes. Increase daily water intake to at least 8 bottle a day, every day.  Goal is to reduse weight by 10% by end of three months, and will re-evaluate then.  RTC in 4 weeks for Nurse visit to check weight & BP, and get next B12 injections.    Please refer to After Visit Summary for other counseling recommendations.    Rebecca Orozco, CNM   Rebecca Orozco, CNM      Consider the Low Glycemic Index Diet and 6 smaller meals daily .  This boosts your metabolism and regulates your sugars:   Use the protein bar by Atkins because they have lots of fiber in them  Find the low carb flatbreads, tortillas and pita breads for sandwiches:  Joseph's makes a pita bread and a flat bread , available at [facility] and [employer]; Toufayah  makes a low carb flatbread available at [employer] and HT that is 9 net carbs and 100 cal Mission makes a low carb whole wheat tortilla available at [employer],and most grocery stores with 6 net carbs and 210 cal  [place] yogurt can still have a lot of carbs .  Dannon Light N fit has 80 cal and 8 carbs

## 2016-06-30 ENCOUNTER — Ambulatory Visit (INDEPENDENT_AMBULATORY_CARE_PROVIDER_SITE_OTHER): Payer: BC Managed Care – PPO | Admitting: Obstetrics and Gynecology

## 2016-06-30 VITALS — BP 120/82 | HR 90 | Ht 66.0 in | Wt 172.7 lb

## 2016-06-30 DIAGNOSIS — E669 Obesity, unspecified: Secondary | ICD-10-CM

## 2016-06-30 DIAGNOSIS — E663 Overweight: Secondary | ICD-10-CM

## 2016-06-30 MED ORDER — CYANOCOBALAMIN 1000 MCG/ML IJ SOLN
1000.0000 ug | Freq: Once | INTRAMUSCULAR | Status: AC
  Administered 2016-06-30: 1000 ug via INTRAMUSCULAR

## 2016-06-30 NOTE — Progress Notes (Signed)
Pt presents for 1 month f/u wt, bp, and b12. She is down 2#. No s/e noted.

## 2016-07-27 ENCOUNTER — Encounter: Payer: BC Managed Care – PPO | Admitting: Obstetrics and Gynecology

## 2016-07-31 ENCOUNTER — Ambulatory Visit (INDEPENDENT_AMBULATORY_CARE_PROVIDER_SITE_OTHER): Payer: BC Managed Care – PPO | Admitting: Obstetrics and Gynecology

## 2016-07-31 VITALS — BP 120/90 | HR 78 | Ht 66.0 in | Wt 169.6 lb

## 2016-07-31 DIAGNOSIS — E663 Overweight: Secondary | ICD-10-CM | POA: Diagnosis not present

## 2016-07-31 MED ORDER — CYANOCOBALAMIN 1000 MCG/ML IJ SOLN
1000.0000 ug | Freq: Once | INTRAMUSCULAR | Status: AC
  Administered 2016-07-31: 1000 ug via INTRAMUSCULAR

## 2016-07-31 NOTE — Progress Notes (Signed)
Patient ID: Rebecca Orozco, female   DOB: August 03, 1981, 35 y.o.   MRN: [id number]  Pt presents for weight, B/P, B-12 injection. No side effects of medication-Phentermine, or B-12.  Weight loss of  3 lbs. Encouraged eating healthy and exercise. B/P 120/90. 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

## 2016-08-30 ENCOUNTER — Encounter: Payer: BC Managed Care – PPO | Admitting: Obstetrics and Gynecology

## 2016-09-20 ENCOUNTER — Encounter: Payer: BC Managed Care – PPO | Admitting: Obstetrics and Gynecology

## 2016-09-22 ENCOUNTER — Other Ambulatory Visit: Payer: Self-pay | Admitting: Obstetrics and Gynecology

## 2016-09-22 ENCOUNTER — Encounter: Payer: Self-pay | Admitting: Obstetrics and Gynecology

## 2016-09-22 ENCOUNTER — Ambulatory Visit (INDEPENDENT_AMBULATORY_CARE_PROVIDER_SITE_OTHER): Payer: BC Managed Care – PPO | Admitting: Obstetrics and Gynecology

## 2016-09-22 VITALS — BP 118/80 | HR 98 | Ht 65.0 in | Wt 168.3 lb

## 2016-09-22 DIAGNOSIS — Z01419 Encounter for gynecological examination (general) (routine) without abnormal findings: Secondary | ICD-10-CM | POA: Diagnosis not present

## 2016-09-22 MED ORDER — CYANOCOBALAMIN 1000 MCG/ML IJ SOLN: 1000.0000 ug | INTRAMUSCULAR | 1 refills | Status: DC

## 2016-09-22 NOTE — Progress Notes (Signed)
Subjective:   Rebecca Orozco is a 36 y.o. No obstetric history on file. Caucasian female here for a routine well-woman exam.  No LMP recorded. Patient is not currently having periods (Reason: IUD).    Current complaints: none PCP: me       Does need desire labs  Social History: Sexual: heterosexual Marital Status: engaged Living situation: alone Occupation: Runner, broadcasting/film/videoteacher Tobacco/alcohol: no tobacco use Illicit drugs: no history of illicit drug use  The following portions of the patient's history were reviewed and updated as appropriate: allergies, current medications, past family history, past medical history, past social history, past surgical history and problem list.  Past Medical History Past Medical History:  Diagnosis Date  . Anxiety and depression   . Flu    flu meningitis as infant  . HTN (hypertension)     Past Surgical History Past Surgical History:  Procedure Laterality Date  . TONSILLECTOMY      Gynecologic History No obstetric history on file.  No LMP recorded. Patient is not currently having periods (Reason: IUD). Contraception: IUD Last Pap: 2015. Results were: normal   Obstetric History OB History  No data available    Current Medications Current Outpatient Prescriptions on File Prior to Visit  Medication Sig Dispense Refill  . levonorgestrel (MIRENA) 20 MCG/24HR IUD 1 each by Intrauterine route once.    . traZODone (DESYREL) 100 MG tablet Take 100 mg by mouth at bedtime.    . cyanocobalamin (,VITAMIN B-12,) 1000 MCG/ML injection Inject 1 mL (1,000 mcg total) into the muscle every 30 (thirty) days. (Patient not taking: Reported on 09/22/2016) 10 mL 1  . phentermine (ADIPEX-P) 37.5 MG tablet Take 1 tablet (37.5 mg total) by mouth daily before breakfast. (Patient not taking: Reported on 09/22/2016) 30 tablet 2   No current facility-administered medications on file prior to visit.     Review of Systems Patient denies any headaches, blurred vision, shortness of  breath, chest pain, abdominal pain, problems with bowel movements, urination, or intercourse.  Objective:  BP 118/80   Pulse 98   Ht 5\' 5"  (1.651 m)   Wt 168 lb 4.8 oz (76.3 kg)   BMI 28.01 kg/m  Physical Exam  General:  Well developed, well nourished, no acute distress. She is alert and oriented x3. Skin:  Warm and dry Neck:  Midline trachea, no thyromegaly or nodules Cardiovascular: Regular rate and rhythm, no murmur heard Lungs:  Effort normal, all lung fields clear to auscultation bilaterally Breasts:  No dominant palpable mass, retraction, or nipple discharge Abdomen:  Soft, non tender, no hepatosplenomegaly or masses Pelvic:  External genitalia is normal in appearance.  The vagina is normal in appearance. The cervix is bulbous, no CMT.  Thin prep pap is done with HR HPV cotesting. Uterus is felt to be normal size, shape, and contour.  No adnexal masses or tenderness noted. IUD string noted Extremities:  No swelling or varicosities noted Psych:  She has a normal mood and affect  Assessment:   Healthy well-woman exam IUD check  Plan:  Will plan to remove IUD in July before wedding F/U 1 year for AE, or sooner if needed   Davontae Prusinski Suzan NailerN Sumi Lye, CNM

## 2016-09-22 NOTE — Patient Instructions (Signed)
 Preventive Care 18-39 Years, Female Preventive care refers to lifestyle choices and visits with your health care provider that can promote health and wellness. What does preventive care include?  A yearly physical exam. This is also called an annual well check.  Dental exams once or twice a year.  Routine eye exams. Ask your health care provider how often you should have your eyes checked.  Personal lifestyle choices, including:  Daily care of your teeth and gums.  Regular physical activity.  Eating a healthy diet.  Avoiding tobacco and drug use.  Limiting alcohol use.  Practicing safe sex.  Taking vitamin and mineral supplements as recommended by your health care provider. What happens during an annual well check? The services and screenings done by your health care provider during your annual well check will depend on your age, overall health, lifestyle risk factors, and family history of disease. Counseling  Your health care provider may ask you questions about your:  Alcohol use.  Tobacco use.  Drug use.  Emotional well-being.  Home and relationship well-being.  Sexual activity.  Eating habits.  Work and work environment.  Method of birth control.  Menstrual cycle.  Pregnancy history. Screening  You may have the following tests or measurements:  Height, weight, and BMI.  Diabetes screening. This is done by checking your blood sugar (glucose) after you have not eaten for a while (fasting).  Blood pressure.  Lipid and cholesterol levels. These may be checked every 5 years starting at age 20.  Skin check.  Hepatitis C blood test.  Hepatitis B blood test.  Sexually transmitted disease (STD) testing.  BRCA-related cancer screening. This may be done if you have a family history of breast, ovarian, tubal, or peritoneal cancers.  Pelvic exam and Pap test. This may be done every 3 years starting at age 21. Starting at age 30, this may be done  every 5 years if you have a Pap test in combination with an HPV test. Discuss your test results, treatment options, and if necessary, the need for more tests with your health care provider. Vaccines  Your health care provider may recommend certain vaccines, such as:  Influenza vaccine. This is recommended every year.  Tetanus, diphtheria, and acellular pertussis (Tdap, Td) vaccine. You may need a Td booster every 10 years.  Varicella vaccine. You may need this if you have not been vaccinated.  HPV vaccine. If you are 26 or younger, you may need three doses over 6 months.  Measles, mumps, and rubella (MMR) vaccine. You may need at least one dose of MMR. You may also need a second dose.  Pneumococcal 13-valent conjugate (PCV13) vaccine. You may need this if you have certain conditions and were not previously vaccinated.  Pneumococcal polysaccharide (PPSV23) vaccine. You may need one or two doses if you smoke cigarettes or if you have certain conditions.  Meningococcal vaccine. One dose is recommended if you are age 19-21 years and a first-year college student living in a residence hall, or if you have one of several medical conditions. You may also need additional booster doses.  Hepatitis A vaccine. You may need this if you have certain conditions or if you travel or work in places where you may be exposed to hepatitis A.  Hepatitis B vaccine. You may need this if you have certain conditions or if you travel or work in places where you may be exposed to hepatitis B.  Haemophilus influenzae type b (Hib) vaccine. You may need   this if you have certain risk factors. Talk to your health care provider about which screenings and vaccines you need and how often you need them. This information is not intended to replace advice given to you by your health care provider. Make sure you discuss any questions you have with your health care provider. Document Released: 10/24/2001 Document Revised:  05/17/2016 Document Reviewed: 06/29/2015 Elsevier Interactive Patient Education  2017 Elsevier Inc.  

## 2016-09-23 LAB — COMPREHENSIVE METABOLIC PANEL
ALT: 15 IU/L (ref 0–32)
AST: 16 IU/L (ref 0–40)
Albumin/Globulin Ratio: 1.9 (ref 1.2–2.2)
Albumin: 5 g/dL (ref 3.5–5.5)
Alkaline Phosphatase: 51 IU/L (ref 39–117)
BUN/Creatinine Ratio: 14 (ref 9–23)
BUN: 12 mg/dL (ref 6–20)
Bilirubin Total: 0.7 mg/dL (ref 0.0–1.2)
CALCIUM: 9.3 mg/dL (ref 8.7–10.2)
CO2: 20 mmol/L (ref 18–29)
Chloride: 97 mmol/L (ref 96–106)
Creatinine, Ser: 0.87 mg/dL (ref 0.57–1.00)
GFR, EST AFRICAN AMERICAN: 99 mL/min/{1.73_m2} (ref >59)
GFR, EST NON AFRICAN AMERICAN: 86 mL/min/{1.73_m2} (ref >59)
GLUCOSE: 101 mg/dL — AB (ref 65–99)
Globulin, Total: 2.7 g/dL (ref 1.5–4.5)
Potassium: 4.4 mmol/L (ref 3.5–5.2)
Sodium: 140 mmol/L (ref 134–144)
TOTAL PROTEIN: 7.7 g/dL (ref 6.0–8.5)

## 2016-09-23 LAB — LIPID PANEL
CHOL/HDL RATIO: 3.7 ratio (ref 0.0–4.4)
Cholesterol, Total: 179 mg/dL (ref 100–199)
HDL: 49 mg/dL (ref >39)
LDL Calculated: 113 mg/dL — ABNORMAL HIGH (ref 0–99)
TRIGLYCERIDES: 86 mg/dL (ref 0–149)
VLDL Cholesterol Cal: 17 mg/dL (ref 5–40)

## 2016-09-25 LAB — CYTOLOGY - PAP

## 2017-03-21 ENCOUNTER — Encounter: Payer: Self-pay | Admitting: Obstetrics and Gynecology

## 2017-03-21 ENCOUNTER — Ambulatory Visit (INDEPENDENT_AMBULATORY_CARE_PROVIDER_SITE_OTHER): Payer: BC Managed Care – PPO | Admitting: Obstetrics and Gynecology

## 2017-03-21 VITALS — BP 119/84 | HR 82 | Ht 65.0 in | Wt 172.0 lb

## 2017-03-21 DIAGNOSIS — Z3169 Encounter for other general counseling and advice on procreation: Secondary | ICD-10-CM

## 2017-03-21 NOTE — Progress Notes (Signed)
Subjective:     Patient ID: Rebecca Orozco, female   DOB: 1981/05/26, 36 y.o.   MRN: [id number]  HPI patient is getting married in 29 days and desires pregnancy as soon as possible, but also does not want menses on wedding date. Has Mirena IUD in place and is having 1-2 days of light spotting monthly.  No concerns, just wanted to discuss removal of IUD. Review of Systems    negative Objective:   Physical Exam A&Ox4 Well groomed female in no distress Blood pressure 119/84, pulse 82, height 5\' 5"  (1.651 m), weight 172 lb (78 kg). PE not indicated    Assessment:     Pre-conception counseling Menstrual suppression    Plan:     Will satrt [place] today and take continuously until returns from honeymoon. We will remove IUD at that time and she is to start PNV then also. recommended condom use until she has one full menses after IUD removed and then trying for pregnancy.  Entire 10 minute visit spent in counseling.  Jovan Schickling SturgeonShambley, CNM

## 2017-04-25 ENCOUNTER — Encounter: Payer: Self-pay | Admitting: Obstetrics and Gynecology

## 2017-04-25 ENCOUNTER — Ambulatory Visit (INDEPENDENT_AMBULATORY_CARE_PROVIDER_SITE_OTHER): Payer: BC Managed Care – PPO | Admitting: Obstetrics and Gynecology

## 2017-04-25 VITALS — BP 138/92 | HR 88 | Ht 65.5 in | Wt 172.3 lb

## 2017-04-25 DIAGNOSIS — Z30432 Encounter for removal of intrauterine contraceptive device: Secondary | ICD-10-CM

## 2017-04-25 NOTE — Progress Notes (Signed)
Rebecca Orozco is a 36 y.o. year old No obstetric history on file. Caucasian female who presents for removal of a Mirena IUD. Her Mirena IUD was placed 4 years ago.   No LMP recorded. Patient is not currently having periods (Reason: IUD). BP (!) 138/92   Pulse 88   Ht 5' 5.5" (1.664 m)   Wt 172 lb 4.8 oz (78.2 kg)   BMI 28.24 kg/m   Time out was performed.  A pederson speculum was placed in the vagina.  The cervix was visualized, and the strings were visible. They were grasped and the Mirena was easily removed intact without complications.   F/U as needed.  Onix Jumper Suzan NailerN Shaylie Eklund, CNM

## 2017-05-15 ENCOUNTER — Telehealth: Payer: Self-pay | Admitting: Obstetrics and Gynecology

## 2017-05-15 ENCOUNTER — Other Ambulatory Visit: Payer: Self-pay | Admitting: *Deleted

## 2017-05-15 MED ORDER — CITRANATAL ASSURE 35-1 & 300 MG PO MISC: 1.0000 | Freq: Every day | ORAL | 6 refills | Status: DC

## 2017-05-15 NOTE — Telephone Encounter (Signed)
Done-ac 

## 2017-05-15 NOTE — Telephone Encounter (Signed)
Patient had Citranatal Assure samples and wants to know if you will send in a script [facility] for them

## 2017-09-07 ENCOUNTER — Ambulatory Visit: Payer: BC Managed Care – PPO | Admitting: Podiatry

## 2017-09-07 ENCOUNTER — Ambulatory Visit: Payer: BC Managed Care – PPO

## 2017-09-07 ENCOUNTER — Encounter: Payer: Self-pay | Admitting: Podiatry

## 2017-09-07 ENCOUNTER — Ambulatory Visit (INDEPENDENT_AMBULATORY_CARE_PROVIDER_SITE_OTHER): Payer: BC Managed Care – PPO

## 2017-09-07 DIAGNOSIS — M6740 Ganglion, unspecified site: Secondary | ICD-10-CM | POA: Diagnosis not present

## 2017-09-07 DIAGNOSIS — M84374A Stress fracture, right foot, initial encounter for fracture: Secondary | ICD-10-CM | POA: Diagnosis not present

## 2017-09-07 DIAGNOSIS — M84375A Stress fracture, left foot, initial encounter for fracture: Secondary | ICD-10-CM

## 2017-09-07 DIAGNOSIS — M779 Enthesopathy, unspecified: Secondary | ICD-10-CM

## 2017-09-07 MED ORDER — METHYLPREDNISOLONE 4 MG PO TBPK: ORAL_TABLET | ORAL | 0 refills | Status: DC

## 2017-09-07 MED ORDER — MELOXICAM 15 MG PO TABS: 15.0000 mg | ORAL_TABLET | Freq: Every day | ORAL | 1 refills | Status: AC

## 2017-09-07 NOTE — Progress Notes (Signed)
Dg  

## 2017-09-13 NOTE — Progress Notes (Signed)
   HPI: 37 year old female presenting today with a complaint of pain to the dorsum of bilateral feet that began 10 days ago.  She states she was moving when the pain began and is now getting progressively worse.  She states in the left is worse than the right.  There are no modifying factors noted.  She has been taking ibuprofen with no significant relief.  Patient is here for further evaluation and treatment.   Past Medical History:  Diagnosis Date  . Anxiety and depression   . Flu    flu meningitis as infant  . HTN (hypertension)      Physical Exam: General: The patient is alert and oriented x3 in no acute distress.  Dermatology: Skin is warm, dry and supple bilateral lower extremities. Negative for open lesions or macerations.  Vascular: Palpable pedal pulses bilaterally. No edema or erythema noted. Capillary refill within normal limits.  Neurological: Epicritic and protective threshold grossly intact bilaterally.   Musculoskeletal Exam: Pain on palpation overlying second and third metatarsals bilaterally, left greater than right.  Range of motion within normal limits to all pedal and ankle joints bilateral. Muscle strength 5/5 in all groups bilateral.   Radiographic Exam:  Normal osseous mineralization. Joint spaces preserved. No fracture/dislocation/boney destruction.    Assessment: -Overuse injury/possible stress fracture second metatarsal bilaterally   Plan of Care:  -Patient evaluated.  X-rays reviewed. -Prescription for Medrol Dosepak provided to patient. -Prescription for Mobic provided to patient. -Recommended wide fitting, supportive sneakers. -Return to clinic in 4 weeks.   Felecia ShellingBrent M. Zykiria Bruening, DPM [facility]  Dr. Felecia ShellingBrent M. Rayaan Lorah, DPM    [address]                                        [place], [place] [address]                Office [phone]  Fax [phone]

## 2017-09-14 ENCOUNTER — Ambulatory Visit: Payer: BC Managed Care – PPO | Admitting: Podiatry

## 2017-10-05 ENCOUNTER — Ambulatory Visit: Payer: BC Managed Care – PPO | Admitting: Podiatry

## 2017-10-09 ENCOUNTER — Encounter: Payer: Self-pay | Admitting: Podiatry

## 2017-10-09 ENCOUNTER — Ambulatory Visit (INDEPENDENT_AMBULATORY_CARE_PROVIDER_SITE_OTHER): Payer: BC Managed Care – PPO | Admitting: Podiatry

## 2017-10-09 ENCOUNTER — Ambulatory Visit (INDEPENDENT_AMBULATORY_CARE_PROVIDER_SITE_OTHER): Payer: BC Managed Care – PPO

## 2017-10-09 DIAGNOSIS — M84374D Stress fracture, right foot, subsequent encounter for fracture with routine healing: Secondary | ICD-10-CM | POA: Diagnosis not present

## 2017-10-09 DIAGNOSIS — M84375D Stress fracture, left foot, subsequent encounter for fracture with routine healing: Secondary | ICD-10-CM | POA: Diagnosis not present

## 2017-10-09 NOTE — Progress Notes (Signed)
   HPI: 37 year old female presenting today for follow up evaluation of bilateral foot pain. She states the right foot has improved significantly. She reports continued left dorsal foot pain. She has been taking Meloxicam with no significant relief. Patient is here for further evaluation and treatment.   Past Medical History:  Diagnosis Date  . Anxiety and depression   . Flu    flu meningitis as infant  . HTN (hypertension)      Physical Exam: General: The patient is alert and oriented x3 in no acute distress.  Dermatology: Skin is warm, dry and supple bilateral lower extremities. Negative for open lesions or macerations.  Vascular: Palpable pedal pulses bilaterally. No edema or erythema noted. Capillary refill within normal limits.  Neurological: Epicritic and protective threshold grossly intact bilaterally.   Musculoskeletal Exam: Pain on palpation overlying second metatarsal of the left foot. Range of motion within normal limits to all pedal and ankle joints bilateral. Muscle strength 5/5 in all groups bilateral.   Radiographic Exam:  Stress fracture with callus formation around the fracture with routine healing.  Assessment: - Stress fracture 2nd metatarsal left foot - right foot pain - resolved   Plan of Care:  - Patient evaluated.  X-rays reviewed. - Compression anklet dispensed.  - Short CAM boot dispensed. Weightbearing as tolerated. - Continue taking Meloxicam daily as directed. - Return to clinic in 4 weeks.  Felecia ShellingBrent M. Ananda Caya, DPM [facility]  Dr. Felecia ShellingBrent M. Liliauna Santoni, DPM    [address]                                        [place], [place] [address]                Office [phone]  Fax [phone]

## 2017-10-15 ENCOUNTER — Encounter: Payer: Self-pay | Admitting: Certified Nurse Midwife

## 2017-10-15 ENCOUNTER — Ambulatory Visit: Payer: BC Managed Care – PPO | Admitting: Certified Nurse Midwife

## 2017-10-15 VITALS — BP 133/103 | HR 77 | Wt 197.4 lb

## 2017-10-15 DIAGNOSIS — N979 Female infertility, unspecified: Secondary | ICD-10-CM | POA: Diagnosis not present

## 2017-10-15 MED ORDER — METFORMIN HCL 500 MG PO TABS: 500.0000 mg | ORAL_TABLET | Freq: Two times a day (BID) | ORAL | 2 refills | Status: DC

## 2017-10-15 MED ORDER — CLOMIPHENE CITRATE 50 MG PO TABS: 50.0000 mg | ORAL_TABLET | Freq: Every day | ORAL | 3 refills | Status: DC

## 2017-10-15 NOTE — Progress Notes (Signed)
PT is doing well.  

## 2017-10-15 NOTE — Progress Notes (Addendum)
GYN ENCOUNTER NOTE  Subjective:       Rebecca Orozco is a 37 y.o. No obstetric history on file. female is here for gynecologic evaluation of the following issues:  1. Infertility.  She states that she had her IUD removed in August 2018 and has been trying since. Given the patients advanced age infertility work up warranted @ 6 months. She has regular cycles every 28-32 days that are light. She states they last for 2-3 days and she changes her pad 2 x a day. She has not done an ovulation kit but is using an app on her phone to predict her fertile days.    Gynecologic History Patient's last menstrual period was 10/14/2017. Contraception: none Last Pap: 09/22/16 Results were: normal Last mammogram: n/a.   Obstetric History OB History  No data available    Past Medical History:  Diagnosis Date  . Anxiety and depression   . Flu    flu meningitis as infant  . HTN (hypertension)     Past Surgical History:  Procedure Laterality Date  . TONSILLECTOMY      Current Outpatient Medications on File Prior to Visit  Medication Sig Dispense Refill  . Prenat w/o [id number] (CITRANATAL ASSURE) 35-1 & 300 MG tablet Take 1 tablet by mouth daily. 30 tablet 6  . traZODone (DESYREL) 100 MG tablet Take 100 mg by mouth at bedtime.     No current facility-administered medications on file prior to visit.     Allergies  Allergen Reactions  . Amoxicillin-Pot Clavulanate Nausea And Vomiting  . Latex Other (See Comments)  . Propoxyphene Nausea And Vomiting    Social History   Socioeconomic History  . Marital status: Divorced    Spouse name: Not on file  . Number of children: Not on file  . Years of education: Not on file  . Highest education level: Not on file  Social Needs  . Financial resource strain: Not on file  . Food insecurity - worry: Not on file  . Food insecurity - inability: Not on file  . Transportation needs - medical: Not on file  . Transportation needs - non-medical:  Not on file  Occupational History  . Not on file  Tobacco Use  . Smoking status: Never Smoker  . Smokeless tobacco: Never Used  . Tobacco comment: tobacco use - no  Substance and Sexual Activity  . Alcohol use: Yes    Alcohol/week: 0.0 oz  . Drug use: No  . Sexual activity: Yes    Birth control/protection: IUD    Comment: mirena  Other Topics Concern  . Not on file  Social History Narrative   Full time teacher at [employer]; gets regular exercise.     Family History  Problem Relation Age of Onset  . Colon cancer Mother 54    The following portions of the patient's history were reviewed and updated as appropriate: allergies, current medications, past family history, past medical history, past social history, past surgical history and problem list.  Review of Systems Review of Systems - Negative except  as mentioned in HPI  Review of Systems - General ROS: negative for - chills, fatigue, fever, hot flashes, malaise or night sweats Hematological and Lymphatic ROS: negative for - bleeding problems or swollen lymph nodes Gastrointestinal ROS: negative for - abdominal pain, blood in stools, change in bowel habits and nausea/vomiting Musculoskeletal ROS: negative for - joint pain, muscle pain or muscular weakness Genito-Urinary ROS: negative for - change in menstrual cycle,  dysmenorrhea, dyspareunia, dysuria, genital discharge, genital ulcers, hematuria, incontinence, irregular/heavy menses, nocturia or pelvic pain  Objective:   BP (!) 133/103   Pulse 77   Wt 197 lb 6.4 oz (89.5 kg)   LMP 10/14/2017   BMI 32.35 kg/m  CONSTITUTIONAL: Well-developed, well-nourished female in no acute distress.  HENT:  Normocephalic, atraumatic.  NECK: Normal range of motion, supple, SKIN: Skin is warm and dry. No rash noted. Not diaphoretic. No erythema. No pallor. [place]: Alert and oriented to person, place, and time.  PSYCHIATRIC: Normal mood and affect. Normal behavior. Normal  judgment and thought content. CARDIOVASCULAR:Not Examined RESPIRATORY: Not Examined BREASTS: Not Examined ABDOMEN: Soft, non distended; Non tender.  No Organomegaly. PELVIC: Not indicated  MUSCULOSKELETAL: Normal range of motion. No tenderness.  No cyanosis, clubbing, or edema.  Assessment:   Infertility  Obesity Advanced age   Plan:  Labs today: TSH, prolactin, testosterone, day 2- FSH/LH , day 2 estradiol. Information given for semen analysis with instructions for sample. Encouraged weight loss with diet and exercise to improve fertility. Discussed use of clomid to induce ovulation. Reviewed benefits and risks . Discussed use of medication along with use of metformin for improved glucose metabolism . She verbalizes understanding and agrees to plan . She return on day 21 or 22 of her cycle for progestin level.  We also discussed her BP , pt encouraged to follow up with primary care for evaluation. Will follow up with lab results.   Philip Aspen, CNM   I attest more than 50% of visit spent reviewing history , discussing infertility and treatment options. Reviewed use of clomid and metformin , risks and benefits. We also discussed plan for lab draw on day 21/22 to check for ovulation or use of ovulation kit. Face to face time 15 min.

## 2017-10-15 NOTE — Patient Instructions (Addendum)
Infertility  Infertility is when you are unable to get pregnant (conceive) after a year of having sex regularly without using birth control. Infertility can also mean that a woman is not able to carry a pregnancy to full term.  Both women and men can have fertility problems.  What causes infertility?  What Causes Infertility in Women?  There are many possible causes of infertility in women. For some women, the cause of infertility is not known (unexplained infertility). Infertility can also be linked to more than one cause. Infertility problems in women can be caused by problems with the menstrual cycle or reproductive organs, certain medical conditions, and factors related to lifestyle and age.   Problems with your menstrual cycle can interfere with your ovaries producing eggs (ovulation). This can make it difficult to get pregnant. This includes having a menstrual cycle that is very long, very short, or irregular.   Problems with reproductive organs can include:  ? An abnormally narrow cervix or a cervix that does not remain closed during a pregnancy.  ? A blockage in your fallopian tubes.  ? An abnormally shaped uterus.  ? Uterine fibroids. This is a tissue mass (tumor) that can develop on your uterus.   Medical conditions that can affect a woman's fertility include:  ? Polycystic ovarian syndrome (PCOS). This is a hormonal disorder that can cause small cysts to grow on your ovaries. This is the most common cause of infertility in women.  ? Endometriosis. This is a condition in which the tissue that lines your uterus (endometrium) grows outside of its normal location.  ? Primary ovary insufficiency. This is when your ovaries stop producing eggs and hormones before the age of 40.  ? Sexually transmitted diseases, such as chlamydia or gonorrhea. These infections can cause scarring in your fallopian tubes. This makes it difficult for eggs to reach your uterus.  ? Autoimmune disorders. These are disorders in which  your immune system attacks normal, healthy cells.  ? Hormone imbalances.   Other factors include:  ? Age. A woman's fertility declines with age, especially after her mid-30s.  ? Being under- or overweight.  ? Drinking too much alcohol.  ? Using drugs.  ? Exercising excessively.  ? Being exposed to environmental toxins, such as radiation, pesticides, and certain chemicals.    What Causes Infertility in Men?  There are many causes of infertility in men. Infertility can be linked to more than one cause. Infertility problems in men can be caused by problems with sperm or the reproductive organs, certain medical conditions, and factors related to lifestyle and age. Some men have unexplained infertility.   Problems with sperm. Infertility can result if there is a problem producing:  ? Enough sperm (low sperm count).  ? Enough normally-shaped sperm (sperm morphology).  ? Sperm that are able to reach the egg (poor motility).   Infertility can also be caused by:  ? A problem with hormones.  ? Enlarged veins (varicoceles), cysts (spermatoceles), or tumors of the testicles.  ? Sexual dysfunction.  ? Injury to the testicles.  ? A birth defect, such as not having the tubes that carry sperm (vas deferens).   Medical conditions that can affect a man's fertility include:  ? Diabetes.  ? Cancer treatments, such as chemotherapy or radiation.  ? Klinefelter syndrome. This is an inherited genetic disorder.  ? Thyroid problems, such as an under- or overactive thyroid.  ? Cystic fibrosis.  ? Sexually transmitted diseases.   Other factors   include:  ? Age. A man's fertility declines with age.  ? Drinking too much alcohol.  ? Using drugs.  ? Being exposed to environmental toxins, such as pesticides and lead.    What are the symptoms of infertility?  Being unable to get pregnant after one year of having regular sex without using birth control is the only sign of infertility.  How is infertility diagnosed?  In order to be diagnosed with  infertility, both partners will have a physical exam. Both partners will also have an extensive medical and sexual history taken. If there is no obvious reason for infertility, additional tests may be done.  What Tests Will Women Have?  Women may first have tests to check whether they are ovulating each month. The tests may include:   Blood tests to check hormone levels.   An ultrasound of the ovaries. This looks for possible problems on or in the ovaries.   Taking a small sample of the tissue that lines the uterus for examination under a microscope (endometrial biopsy).    Women who are ovulating may have additional tests. These may include:   Hysterosalpingography.  ? This is an X-ray of the fallopian tubes and uterus taken after a specific type of dye is injected.  ? This test can show the shape of the uterus and whether the fallopian tubes are open.   Laparoscopy.  ? In this test, a lighted tube (laparoscope) is used to look for problems in the fallopian tubes and other female organs.   Transvaginal ultrasound.  ? This is an imaging test to check for abnormalities of the uterus and ovaries.  ? A health care provider can use this test to count the number of follicles on the ovaries.   Hysteroscopy.  ? This test involves using a lighted tube to examine the cervix and inside the uterus.  ? It is done to find any abnormalities inside the uterus.    What Tests Will Men Have?  Tests for men's infertility includes:   Semen tests to check sperm count, morphology, and motility.   Blood tests to check for hormone levels.   Taking a small sample of tissue from inside a testicle (biopsy). This is examined under a microscope.   Blood tests to check for genetic abnormalities (genetic testing).    How are women treated for infertility?  Treatment depends on the cause of infertility. Most cases of infertility in women are treated with medicine or surgery.   Women may take medicine to:  ? Correct ovulation  problems.  ? Treat other health conditions, such as PCOS.   Surgery may be done to:  ? Repair damage to the ovaries, fallopian tubes, cervix, or uterus.  ? Remove growths from the uterus.  ? Remove scar tissue from the uterus, pelvis, or other female organs.    How are men treated for infertility?  Treatment depends on the cause of infertility. Most cases of infertility in men are treated with medicine or surgery.   Men may take medicine to:  ? Correct hormone problems.  ? Treat other health conditions.  ? Treat sexual dysfunction.   Surgery may be done to:  ? Remove blockages in the reproductive tract.  ? Correct other structural problems of the reproductive tract.    What is assisted reproductive technology?  Assisted reproductive technology (ART) refers to all treatments and procedures that combine eggs and sperm outside the body to try to help a couple conceive. ART is often   combined with fertility drugs to stimulate ovulation. Sometimes ART is done using eggs retrieved from another woman's body (donor eggs) or from previously frozen fertilized eggs (embryos).  There are different types of ART. These include:   Intrauterine insemination (IUI).  ? In this procedure, sperm is placed directly into a woman's uterus with a long, thin tube.  ? This may be most effective for infertility caused by sperm problems, including low sperm count and low motility.  ? Can be used in combination with fertility drugs.   In vitro fertilization (IVF).  ? This is often done when a woman's fallopian tubes are blocked or when a man has low sperm counts.  ? Fertility drugs stimulate the ovaries to produce multiple eggs. Once mature, these eggs are removed from the body and combined with the sperm to be fertilized.  ? These fertilized eggs are then placed in the woman's uterus.    This information is not intended to replace advice given to you by your health care provider. Make sure you discuss any questions you have with your  health care provider.  Document Released: 08/31/2003 Document Revised: 01/28/2016 Document Reviewed: 05/13/2014  Elsevier Interactive Patient Education  2018 [employer]

## 2017-10-16 ENCOUNTER — Other Ambulatory Visit: Payer: Self-pay | Admitting: Certified Nurse Midwife

## 2017-10-16 LAB — FSH/LH
FSH: 6.5 m[IU]/mL
LH: 9.1 m[IU]/mL

## 2017-10-16 LAB — ESTRADIOL: Estradiol: 27.7 pg/mL

## 2017-10-16 LAB — PROLACTIN: Prolactin: 14 ng/mL (ref 4.8–23.3)

## 2017-10-16 LAB — TESTOSTERONE: TESTOSTERONE: 28 ng/dL (ref 8–48)

## 2017-10-16 LAB — TSH: TSH: 0.708 u[IU]/mL (ref 0.450–4.500)

## 2017-10-16 MED ORDER — CLOMIPHENE CITRATE 50 MG PO TABS: 50.0000 mg | ORAL_TABLET | Freq: Every day | ORAL | 0 refills | Status: DC

## 2017-10-16 MED ORDER — CLOMIPHENE CITRATE 50 MG PO TABS: 50.0000 mg | ORAL_TABLET | Freq: Every day | ORAL | 2 refills | Status: AC

## 2017-10-16 NOTE — Progress Notes (Signed)
Error in order for clomid. Was for days 3-7 total of 5 pills. Was written to dispense 4 pills. Order placed for one additional pill . Discontinued the 4 pills and new order placed for the 5 pills.   Pt notified of lab results. Reviewed plan of care.   Doreene BurkeAnnie Kanyla Omeara, CNM

## 2017-11-02 ENCOUNTER — Other Ambulatory Visit: Payer: BC Managed Care – PPO

## 2017-11-06 ENCOUNTER — Encounter: Payer: Self-pay | Admitting: Podiatry

## 2017-11-06 ENCOUNTER — Ambulatory Visit (INDEPENDENT_AMBULATORY_CARE_PROVIDER_SITE_OTHER): Payer: BC Managed Care – PPO

## 2017-11-06 ENCOUNTER — Ambulatory Visit: Payer: BC Managed Care – PPO | Admitting: Podiatry

## 2017-11-06 DIAGNOSIS — M84375D Stress fracture, left foot, subsequent encounter for fracture with routine healing: Secondary | ICD-10-CM

## 2017-11-06 DIAGNOSIS — M84375A Stress fracture, left foot, initial encounter for fracture: Secondary | ICD-10-CM | POA: Diagnosis not present

## 2017-11-08 NOTE — Progress Notes (Signed)
   HPI: 37 year old female presenting today for follow up evaluation of a stress fracture of the 2nd metatarsal of the left foot. She states the pain has improved. She has been wearing the CAM boot and compression sock as directed. Patient is here for further evaluation and treatment.   Past Medical History:  Diagnosis Date  . Anxiety and depression   . Flu    flu meningitis as infant  . HTN (hypertension)      Physical Exam: General: The patient is alert and oriented x3 in no acute distress.  Dermatology: Skin is warm, dry and supple bilateral lower extremities. Negative for open lesions or macerations.  Vascular: Palpable pedal pulses bilaterally. No edema or erythema noted. Capillary refill within normal limits.  Neurological: Epicritic and protective threshold grossly intact bilaterally.   Musculoskeletal Exam: Pain on palpation overlying second metatarsal of the left foot. Range of motion within normal limits to all pedal and ankle joints bilateral. Muscle strength 5/5 in all groups bilateral.   Radiographic Exam:  Stress fracture with callus formation around the fracture with routine healing.  Assessment: - Stress fracture 2nd metatarsal left foot - improved   Plan of Care:  - Patient evaluated.  X-rays reviewed. - Continue wearing CAM boot x 2 more weeks, then transition into good sneakers.  - Return to clinic as needed.  Felecia ShellingBrent M. Roshan Salamon, DPM [facility]  Dr. Felecia ShellingBrent M. Marialuiza Car, DPM    [address]                                        [place], [place] [address]                Office [phone]  Fax [phone]

## 2017-12-18 ENCOUNTER — Other Ambulatory Visit: Payer: Self-pay | Admitting: Certified Nurse Midwife

## 2017-12-25 ENCOUNTER — Encounter: Payer: Self-pay | Admitting: Certified Nurse Midwife

## 2017-12-26 ENCOUNTER — Encounter: Payer: Self-pay | Admitting: Certified Nurse Midwife

## 2017-12-31 ENCOUNTER — Ambulatory Visit: Payer: BC Managed Care – PPO | Admitting: Family Medicine

## 2018-01-04 ENCOUNTER — Encounter: Payer: Self-pay | Admitting: Certified Nurse Midwife

## 2018-01-04 ENCOUNTER — Other Ambulatory Visit: Payer: Self-pay | Admitting: Certified Nurse Midwife

## 2018-01-04 DIAGNOSIS — N979 Female infertility, unspecified: Secondary | ICD-10-CM

## 2018-01-04 NOTE — Progress Notes (Signed)
Orders placed for referral due to infertility   Rebecca BurkeAnnie Taleisha Orozco, CNM

## 2018-01-16 ENCOUNTER — Encounter: Payer: Self-pay | Admitting: Certified Nurse Midwife

## 2018-01-22 ENCOUNTER — Encounter: Payer: Self-pay | Admitting: Obstetrics and Gynecology

## 2018-01-23 ENCOUNTER — Other Ambulatory Visit: Payer: BC Managed Care – PPO

## 2018-01-23 DIAGNOSIS — N912 Amenorrhea, unspecified: Secondary | ICD-10-CM

## 2018-01-24 LAB — BETA HCG QUANT (REF LAB)

## 2018-01-25 ENCOUNTER — Other Ambulatory Visit: Payer: Self-pay | Admitting: Obstetrics and Gynecology

## 2018-01-25 MED ORDER — MEDROXYPROGESTERONE ACETATE 10 MG PO TABS: 10.0000 mg | ORAL_TABLET | Freq: Every day | ORAL | 2 refills | Status: DC

## 2018-01-28 ENCOUNTER — Other Ambulatory Visit: Payer: Self-pay | Admitting: *Deleted

## 2018-01-28 MED ORDER — METFORMIN HCL 500 MG PO TABS: 500.0000 mg | ORAL_TABLET | Freq: Two times a day (BID) | ORAL | 2 refills | Status: DC

## 2018-06-25 ENCOUNTER — Encounter: Payer: Self-pay | Admitting: Primary Care

## 2018-06-25 ENCOUNTER — Ambulatory Visit: Payer: BC Managed Care – PPO | Admitting: Primary Care

## 2018-06-25 VITALS — BP 124/94 | HR 77 | Temp 98.2°F | Ht 65.0 in | Wt 199.5 lb

## 2018-06-25 DIAGNOSIS — I10 Essential (primary) hypertension: Secondary | ICD-10-CM

## 2018-06-25 DIAGNOSIS — F341 Dysthymic disorder: Secondary | ICD-10-CM | POA: Diagnosis not present

## 2018-06-25 MED ORDER — AMLODIPINE BESYLATE 10 MG PO TABS: ORAL_TABLET | ORAL | 0 refills | Status: DC

## 2018-06-25 NOTE — Patient Instructions (Signed)
Start amlodipine 10 mg tablets for blood pressure. Take 1 tablet by mouth once daily.  Schedule a follow up visit in 2-3 weeks for blood pressure check.  It was a pleasure to meet you today! Please don't hesitate to call or message me with any questions. Welcome to [facility]!

## 2018-06-25 NOTE — Assessment & Plan Note (Signed)
Following with psychiatry. Suspect the majority of her stress/anxiety comes from occupation for which she is working to change. Continue current regimen.

## 2018-06-25 NOTE — Progress Notes (Signed)
Subjective:    Patient ID: Rebecca Orozco, female    DOB: 12/20/1980, 37 y.o.   MRN: [id number]  HPI  Rebecca Orozco is a 37 year old female who presents today to establish care and discuss the problems mentioned below. Will obtain old records.  1) Essential Hypertension: History of in the past. She was once managed on lisinopril and HCTZ in the past along with oral potassium due to hypokalemia. She has not taken these medications in several years. She's checking her BP at home and getting readings ranging 140's-150's/90's-low 100's. She is trying to become pregnant and is seeing a fertility specialist. She has noticed some symptoms of dizziness and palpitations.   BP Readings from Last 3 Encounters:  06/25/18 (!) 124/94  10/15/17 (!) 133/103  04/25/17 (!) 138/92     2) Anxiety and Depression: Currently managed on Zoloft 50 mg, Trazodone 100 mg and clonazepam 0.5 mg PRN. She is currently following with psychiatry. She works in a high stress job for which she has recently resigned but has two months remaining. 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    3) Fertility Treatment: Currently managed on letrozole 5 mg daily and Ovidrel 250 mg injections monthly. Following with fertility specialist.   Review of Systems  Constitutional: Negative for fatigue.  Respiratory: Negative for shortness of breath.   Cardiovascular: Positive for palpitations. Negative for chest pain.  Neurological: Positive for dizziness. Negative for headaches.  Psychiatric/Behavioral:       See HPI       Past Medical History:  Diagnosis Date  . Anxiety and depression   . Flu    flu meningitis as infant  . HTN (hypertension)   . Migraines      Social History   Socioeconomic History  . Marital status: Married    Spouse name: Not on file  . Number of children: Not on file  . Years of education: Not on file  . Highest education level: Not on file  Occupational History  . Not on file  Social  Needs  . Financial resource strain: Not on file  . Food insecurity:    Worry: Not on file    Inability: Not on file  . Transportation needs:    Medical: Not on file    Non-medical: Not on file  Tobacco Use  . Smoking status: Never Smoker  . Smokeless tobacco: Never Used  . Tobacco comment: tobacco use - no  Substance and Sexual Activity  . Alcohol use: Yes    Alcohol/week: 0.0 standard drinks  . Drug use: No  . Sexual activity: Yes    Birth control/protection: IUD    Comment: mirena  Lifestyle  . Physical activity:    Days per week: Not on file    Minutes per session: Not on file  . Stress: Not on file  Relationships  . Social connections:    Talks on phone: Not on file    Gets together: Not on file    Attends religious service: Not on file    Active member of club or organization: Not on file    Attends meetings of clubs or organizations: Not on file    Relationship status: Not on file  . Intimate partner violence:    Fear of current or ex partner: Not on file    Emotionally abused: Not on file    Physically abused: Not on file    Forced sexual activity: Not on file  Other Topics Concern  . Not on file  Social History Narrative   Full time teacher at [employer]; gets regular exercise.     Past Surgical History:  Procedure Laterality Date  . TONSILLECTOMY    . VARICOSE VEIN SURGERY Right     Family History  Problem Relation Age of Onset  . Colon cancer Mother 39    Allergies  Allergen Reactions  . Amoxicillin-Pot Clavulanate Nausea And Vomiting  . Latex Other (See Comments)  . Propoxyphene Nausea And Vomiting    Current Outpatient Medications on File Prior to Visit  Medication Sig Dispense Refill  . Choriogonadotropin Alfa (OVIDREL) 250 MCG/0.5ML injection Inject 250 mcg into the skin every 30 (thirty) days.    . clonazePAM (KLONOPIN) 0.5 MG tablet Take 0.5 mg by mouth as needed.     Marland Kitchen letrozole (FEMARA) 2.5 MG tablet Take 5 mg by mouth daily.       . sertraline (ZOLOFT) 50 MG tablet Take 50 mg by mouth daily.    . traZODone (DESYREL) 100 MG tablet Take 100-200 mg by mouth at bedtime.      No current facility-administered medications on file prior to visit.     BP (!) 124/94   Pulse 77   Temp 98.2 F (36.8 C) (Oral)   Ht 5\' 5"  (1.651 m)   Wt 199 lb 8 oz (90.5 kg)   LMP 06/21/2018   SpO2 98%   BMI 33.20 kg/m    Objective:   Physical Exam  Constitutional: She appears well-nourished.  Neck: Neck supple.  Cardiovascular: Normal rate and regular rhythm.  Respiratory: Effort normal and breath sounds normal.  Skin: Skin is warm and dry.  Psychiatric: She has a normal mood and affect.           Assessment & Plan:

## 2018-06-25 NOTE — Assessment & Plan Note (Signed)
History of years ago, recently uncontrolled and requires treatment.  Will avoid ACE's/ARB's as she's working on becoming pregnant. Also avoid HCTZ for now given history of hypokalemia.   Rx for Amlodipine sent to pharmacy. Discussed potential side effects. Will plan to see her back in 2-3 weeks for BP check. She will monitor BP at home in the interim.

## 2018-07-17 ENCOUNTER — Ambulatory Visit (INDEPENDENT_AMBULATORY_CARE_PROVIDER_SITE_OTHER): Payer: BC Managed Care – PPO | Admitting: Primary Care

## 2018-07-17 DIAGNOSIS — I10 Essential (primary) hypertension: Secondary | ICD-10-CM | POA: Diagnosis not present

## 2018-07-17 MED ORDER — AMLODIPINE BESYLATE 10 MG PO TABS: ORAL_TABLET | ORAL | 3 refills | Status: DC

## 2018-07-17 NOTE — Progress Notes (Signed)
Subjective:    Patient ID: Rebecca Orozco, female    DOB: May 08, 1981, 37 y.o.   MRN: [id number]  HPI  Rebecca Orozco is a 37 year old female who presents today for follow up of hypertension.   She was last evaluated on 06/25/18 as a new patient with elevated blood pressure readings. She had a history of hypertension in the past and was medically treated. She is attempting pregnancy and is seeing a fertility specialist. Given prior history of hypertension coupled with elevated readings we initiated treatment with Amlodipine 10 mg.   Since her last visit she denies headaches, dizziness, increased ankle edema. She's checking her BP at home and is getting readings of 120's/90's. She thinks her BP meter is old.   BP Readings from Last 3 Encounters:  07/17/18 114/72  06/25/18 (!) 124/94  10/15/17 (!) 133/103     Review of Systems  Respiratory: Negative for shortness of breath.   Cardiovascular: Negative for chest pain and leg swelling.  Neurological: Negative for dizziness and headaches.       Past Medical History:  Diagnosis Date  . Anxiety and depression   . Cervical dysplasia   . Flu    flu meningitis as infant  . HTN (hypertension)   . Migraines      Social History   Socioeconomic History  . Marital status: Married    Spouse name: Not on file  . Number of children: Not on file  . Years of education: Not on file  . Highest education level: Not on file  Occupational History  . Not on file  Social Needs  . Financial resource strain: Not on file  . Food insecurity:    Worry: Not on file    Inability: Not on file  . Transportation needs:    Medical: Not on file    Non-medical: Not on file  Tobacco Use  . Smoking status: Never Smoker  . Smokeless tobacco: Never Used  . Tobacco comment: tobacco use - no  Substance and Sexual Activity  . Alcohol use: Yes    Alcohol/week: 0.0 standard drinks  . Drug use: No  . Sexual activity: Yes    Birth control/protection: IUD   Comment: mirena  Lifestyle  . Physical activity:    Days per week: Not on file    Minutes per session: Not on file  . Stress: Not on file  Relationships  . Social connections:    Talks on phone: Not on file    Gets together: Not on file    Attends religious service: Not on file    Active member of club or organization: Not on file    Attends meetings of clubs or organizations: Not on file    Relationship status: Not on file  . Intimate partner violence:    Fear of current or ex partner: Not on file    Emotionally abused: Not on file    Physically abused: Not on file    Forced sexual activity: Not on file  Other Topics Concern  . Not on file  Social History Narrative   Full time teacher at [employer]; gets regular exercise.     Past Surgical History:  Procedure Laterality Date  . TONSILLECTOMY    . VARICOSE VEIN SURGERY Right     Family History  Problem Relation Age of Onset  . Colon cancer Mother 41    Allergies  Allergen Reactions  . Amoxicillin-Pot Clavulanate Nausea And Vomiting  . Latex Other (  See Comments)  . Propoxyphene Nausea And Vomiting    Current Outpatient Medications on File Prior to Visit  Medication Sig Dispense Refill  . Choriogonadotropin Alfa (OVIDREL) 250 MCG/0.5ML injection Inject 250 mcg into the skin every 30 (thirty) days.    . clonazePAM (KLONOPIN) 0.5 MG tablet Take 0.5 mg by mouth as needed.     Marland Kitchen letrozole (FEMARA) 2.5 MG tablet Take 5 mg by mouth daily.     . sertraline (ZOLOFT) 50 MG tablet Take 50 mg by mouth daily.    . traZODone (DESYREL) 100 MG tablet Take 100-200 mg by mouth at bedtime.      No current facility-administered medications on file prior to visit.     BP 114/72   Pulse 74   Temp 98.1 F (36.7 C) (Oral)   Ht 5\' 5"  (1.651 m)   Wt 201 lb 4 oz (91.3 kg)   LMP 06/21/2018   SpO2 96%   BMI 33.49 kg/m    Objective:   Physical Exam  Constitutional: She appears well-nourished.  Neck: Neck supple.    Cardiovascular: Normal rate and regular rhythm.  Respiratory: Effort normal and breath sounds normal.  Skin: Skin is warm and dry.           Assessment & Plan:

## 2018-07-17 NOTE — Patient Instructions (Signed)
Continue amlodipine 10 mg tablets for high blood pressure.  It was a pleasure to see you today!

## 2018-07-17 NOTE — Assessment & Plan Note (Signed)
Improved on Amlodipine 10 mg, continue same. Refills sent to pharmacy.

## 2018-09-13 ENCOUNTER — Encounter: Payer: Self-pay | Admitting: Obstetrics and Gynecology

## 2018-09-13 ENCOUNTER — Encounter: Payer: BC Managed Care – PPO | Admitting: Obstetrics and Gynecology

## 2018-09-13 ENCOUNTER — Ambulatory Visit (INDEPENDENT_AMBULATORY_CARE_PROVIDER_SITE_OTHER): Payer: BC Managed Care – PPO | Admitting: Obstetrics and Gynecology

## 2018-09-13 VITALS — BP 128/98 | HR 98 | Ht 65.0 in | Wt 196.7 lb

## 2018-09-13 DIAGNOSIS — N926 Irregular menstruation, unspecified: Secondary | ICD-10-CM

## 2018-09-13 LAB — BETA HCG QUANT (REF LAB): hCG Quant: 3870 m[IU]/mL

## 2018-09-13 LAB — POCT URINE PREGNANCY: Preg Test, Ur: POSITIVE — AB

## 2018-09-13 LAB — PROGESTERONE: Progesterone: 8.5 ng/mL

## 2018-09-13 NOTE — Progress Notes (Signed)
  Subjective:     Patient ID: [place], female   DOB: Feb 01, 1981, 38 y.o.   MRN: [id number]  HPI Here for pregnancy confirmation and reports light brown spotting today.  LMP 08/07/18, giving EDC 05/14/19 and EGA [redacted]w[redacted]d. Denies any other concerns. Is still taking zoloft and norvasc daily, happy about desired pregnancy    Review of Systems  All other systems reviewed and are negative.      Objective:   Physical Exam A&Ox4 Well groomed female in no distress Blood pressure (!) 128/98, pulse 98, height 5\' 5"  (1.651 m), weight 196 lb 11.2 oz (89.2 kg), last menstrual period 08/07/2018. UPT+ Pelvic exam: normal external genitalia, vulva, vagina, cervix, uterus and adnexa, CERVIX: cervical discharge present - scant dark red bloody .    Assessment:     Missed menses Spotting in the first trimester Hypertension Obesity     Plan:     Labs obtained-will repeat in 2-3 days with ultrasound next week for viability. Counseled on signs of miscarriage. To obstain from sex for one week.  Ariya Bohannon,CNM

## 2018-09-16 ENCOUNTER — Ambulatory Visit: Payer: BC Managed Care – PPO

## 2018-09-16 DIAGNOSIS — N926 Irregular menstruation, unspecified: Secondary | ICD-10-CM

## 2018-09-17 ENCOUNTER — Other Ambulatory Visit: Payer: Self-pay | Admitting: Obstetrics and Gynecology

## 2018-09-17 LAB — BETA HCG QUANT (REF LAB): hCG Quant: 6381 m[IU]/mL

## 2018-09-17 LAB — PROGESTERONE: Progesterone: 7.8 ng/mL

## 2018-09-17 MED ORDER — PROGESTERONE MICRONIZED 200 MG PO CAPS: ORAL_CAPSULE | ORAL | 3 refills | Status: DC

## 2018-09-19 ENCOUNTER — Ambulatory Visit (INDEPENDENT_AMBULATORY_CARE_PROVIDER_SITE_OTHER): Payer: BC Managed Care – PPO

## 2018-09-19 DIAGNOSIS — N926 Irregular menstruation, unspecified: Secondary | ICD-10-CM

## 2018-09-19 DIAGNOSIS — N8311 Corpus luteum cyst of right ovary: Secondary | ICD-10-CM

## 2018-09-19 DIAGNOSIS — O3481 Maternal care for other abnormalities of pelvic organs, first trimester: Secondary | ICD-10-CM | POA: Diagnosis not present

## 2018-09-19 DIAGNOSIS — O3680X0 Pregnancy with inconclusive fetal viability, not applicable or unspecified: Secondary | ICD-10-CM | POA: Diagnosis not present

## 2018-09-23 ENCOUNTER — Other Ambulatory Visit: Payer: BC Managed Care – PPO

## 2018-09-23 DIAGNOSIS — N979 Female infertility, unspecified: Secondary | ICD-10-CM

## 2018-09-23 DIAGNOSIS — N926 Irregular menstruation, unspecified: Secondary | ICD-10-CM

## 2018-09-23 LAB — PROGESTERONE: Progesterone: 13.9 ng/mL

## 2018-09-23 LAB — BETA HCG QUANT (REF LAB): hCG Quant: 20996 m[IU]/mL

## 2018-09-24 ENCOUNTER — Encounter: Payer: BC Managed Care – PPO | Admitting: Obstetrics and Gynecology

## 2018-10-01 ENCOUNTER — Other Ambulatory Visit: Payer: Self-pay | Admitting: Obstetrics and Gynecology

## 2018-10-01 ENCOUNTER — Ambulatory Visit (INDEPENDENT_AMBULATORY_CARE_PROVIDER_SITE_OTHER): Payer: BC Managed Care – PPO

## 2018-10-01 DIAGNOSIS — O3680X0 Pregnancy with inconclusive fetal viability, not applicable or unspecified: Secondary | ICD-10-CM | POA: Diagnosis not present

## 2018-10-01 DIAGNOSIS — Z3A01 Less than 8 weeks gestation of pregnancy: Secondary | ICD-10-CM | POA: Diagnosis not present

## 2018-10-01 DIAGNOSIS — Z3201 Encounter for pregnancy test, result positive: Secondary | ICD-10-CM

## 2018-10-02 ENCOUNTER — Other Ambulatory Visit: Payer: BC Managed Care – PPO

## 2018-10-02 ENCOUNTER — Other Ambulatory Visit: Payer: Self-pay | Admitting: Obstetrics and Gynecology

## 2018-10-02 ENCOUNTER — Other Ambulatory Visit: Payer: Self-pay

## 2018-10-02 DIAGNOSIS — N926 Irregular menstruation, unspecified: Secondary | ICD-10-CM

## 2018-10-02 MED ORDER — CITRANATAL ASSURE 35-1 & 300 MG PO MISC: 1.0000 | Freq: Every day | ORAL | 3 refills | Status: DC

## 2018-10-03 ENCOUNTER — Other Ambulatory Visit: Payer: Self-pay | Admitting: Obstetrics and Gynecology

## 2018-10-03 LAB — BETA HCG QUANT (REF LAB): hCG Quant: 24905 m[IU]/mL

## 2018-10-03 LAB — PROGESTERONE: PROGESTERONE: 9.9 ng/mL

## 2018-10-10 ENCOUNTER — Other Ambulatory Visit: Payer: Self-pay | Admitting: Obstetrics and Gynecology

## 2018-10-10 ENCOUNTER — Other Ambulatory Visit: Payer: BC Managed Care – PPO

## 2018-10-10 DIAGNOSIS — N926 Irregular menstruation, unspecified: Secondary | ICD-10-CM

## 2018-10-11 ENCOUNTER — Other Ambulatory Visit: Payer: Self-pay | Admitting: Obstetrics and Gynecology

## 2018-10-11 DIAGNOSIS — O200 Threatened abortion: Secondary | ICD-10-CM

## 2018-10-11 LAB — BETA HCG QUANT (REF LAB): hCG Quant: 18554 m[IU]/mL

## 2018-10-11 MED ORDER — MISOPROSTOL 200 MCG PO TABS: 400.0000 ug | ORAL_TABLET | ORAL | 1 refills | Status: DC

## 2018-10-15 ENCOUNTER — Other Ambulatory Visit: Payer: BC Managed Care – PPO

## 2018-10-24 ENCOUNTER — Encounter: Payer: Self-pay | Admitting: Obstetrics and Gynecology

## 2018-10-24 ENCOUNTER — Ambulatory Visit: Payer: BC Managed Care – PPO | Admitting: Obstetrics and Gynecology

## 2018-10-24 VITALS — BP 96/71 | HR 85 | Ht 65.0 in | Wt 210.2 lb

## 2018-10-24 DIAGNOSIS — O200 Threatened abortion: Secondary | ICD-10-CM

## 2018-10-24 DIAGNOSIS — B373 Candidiasis of vulva and vagina: Secondary | ICD-10-CM | POA: Diagnosis not present

## 2018-10-24 DIAGNOSIS — B3731 Acute candidiasis of vulva and vagina: Secondary | ICD-10-CM

## 2018-10-24 MED ORDER — FLUCONAZOLE 150 MG PO TABS: 150.0000 mg | ORAL_TABLET | Freq: Once | ORAL | 3 refills | Status: AC

## 2018-10-24 NOTE — Progress Notes (Signed)
  Subjective:     Patient ID: Rebecca Orozco, female   DOB: 27-Aug-1981, 38 y.o.   MRN: [id number]  HPI        Here for follow up after completing cytotec for missed miscarriage. States she had 3-4 days of heavy bleeding and still seeing dark red spotting now.  Does also report vaginal irritation for 4 days and took a diflucan over the weekend and it helped some, still itching a lot on the outside.   Desires trying for another pregnancy as soon as possible.                         Objective:   Physical Exam A&Ox4 Well groomed female in no distress Blood pressure 96/71, pulse 85, height 5\' 5"  (1.651 m), weight 210 lb 3.2 oz (95.3 kg), last menstrual period 10/12/2018, unknown if currently breastfeeding. Pelvic exam: VULVA: normal appearing vulva with no masses, tenderness or lesions, vulvar erythema throughout, VAGINA: normal appearing vagina with normal color and discharge, no lesions, vaginal discharge - dark, bloody and scant, CERVIX: normal appearing cervix without discharge or lesions, WET MOUNT done - results: hyphae, lactobacilli.    Assessment:     Follow up miscarriage Yeast infection    Plan:     Lab obtained to confirm pregnancy levels dropped. Diflucan 150mg  po rx sent in. Instructed to wait for one normal menses and then can start trying to achieve pregnancy again.  RTC as needed.   Melody Shambley,CNM

## 2018-10-25 ENCOUNTER — Encounter: Payer: BC Managed Care – PPO | Admitting: Obstetrics and Gynecology

## 2018-10-25 LAB — BETA HCG QUANT (REF LAB): hCG Quant: 30 m[IU]/mL

## 2018-11-01 ENCOUNTER — Encounter: Payer: BC Managed Care – PPO | Admitting: Obstetrics and Gynecology

## 2019-02-27 ENCOUNTER — Other Ambulatory Visit: Payer: Self-pay | Admitting: Obstetrics and Gynecology

## 2019-02-27 MED ORDER — CLOMIPHENE CITRATE 50 MG PO TABS: 50.0000 mg | ORAL_TABLET | Freq: Every day | ORAL | 3 refills | Status: DC

## 2019-03-06 ENCOUNTER — Encounter: Payer: Self-pay | Admitting: *Deleted

## 2019-04-14 ENCOUNTER — Ambulatory Visit (INDEPENDENT_AMBULATORY_CARE_PROVIDER_SITE_OTHER): Payer: BC Managed Care – PPO | Admitting: Primary Care

## 2019-04-14 ENCOUNTER — Other Ambulatory Visit: Payer: Self-pay

## 2019-04-14 ENCOUNTER — Encounter: Payer: Self-pay | Admitting: Primary Care

## 2019-04-14 VITALS — Ht 65.0 in | Wt 210.0 lb

## 2019-04-14 DIAGNOSIS — I10 Essential (primary) hypertension: Secondary | ICD-10-CM

## 2019-04-14 DIAGNOSIS — Z0289 Encounter for other administrative examinations: Secondary | ICD-10-CM | POA: Diagnosis not present

## 2019-04-14 DIAGNOSIS — N979 Female infertility, unspecified: Secondary | ICD-10-CM

## 2019-04-14 NOTE — Patient Instructions (Signed)
  Please send me the paperwork as discussed.  It was a pleasure to see you today!

## 2019-04-14 NOTE — Assessment & Plan Note (Signed)
Following with fertility specialists, undergoing second round of treatment.   I do not see why Rebecca Orozco cannot continue to work from home given her current role and ability to work from home. Rebecca Orozco and her husband are trying to conceive and would be considered a higher risk pregnancy if they were to become pregnant. Given this and her history of hypertension I will support her to continue working from home.

## 2019-04-14 NOTE — Progress Notes (Signed)
Subjective:    Patient ID: Rebecca Orozco, female    DOB: 02/26/1981, 38 y.o.   MRN: [id number]  HPI  Virtual Visit via Video Note  I connected with Rebecca Orozco on 04/14/19 at  7:40 AM EDT by a video enabled telemedicine application and verified that I am speaking with the correct person using two identifiers.  Location: Patient: Home Provider: Office   I discussed the limitations of evaluation and management by telemedicine and the availability of in person appointments. The patient expressed understanding and agreed to proceed.  History of Present Illness:  Rebecca Orozco is a 38 year old female with a history of cervical dysplasia, hypertension, anxiety and depression, infertiligy who presents today to discuss work accomodation.   She works as an Environmental consultant principal for [employer] and is requesting a Quarry manager for continued [employer]. She has been working from home since March 2020 and would like to continue.   She and her husband are going through their second round of fertility treatments and she doesn't want to risk any potential exposure to Covid-19 that would compromise their chances at conceiving and/or pregnancy.  She has done well working at home.   Observations/Objective:  Alert and oriented. Appears well, not sickly. No distress. Speaking in complete sentences.   Assessment and Plan:  I do not see why she cannot continue to work from home given her current role and ability to work from home. She and her husband are trying to conceive and would be considered a higher risk pregnancy if they were to become pregnant. Given this and her history of hypertension I will support her to continue working from home.  Follow Up Instructions:  Please send me the paperwork as discussed.  It was a pleasure to see you today!    I discussed the assessment and treatment plan with the patient. The patient was provided an opportunity to ask questions and all were answered. The  patient agreed with the plan and demonstrated an understanding of the instructions.   The patient was advised to call back or seek an in-person evaluation if the symptoms worsen or if the condition fails to improve as anticipated.     Pleas Koch, NP    Review of Systems  Respiratory: Negative for shortness of breath.   Cardiovascular: Negative for chest pain.  Psychiatric/Behavioral:       Feeling anxious about returning to the office. See HPI       Past Medical History:  Diagnosis Date  . Anxiety and depression   . Cervical dysplasia   . Flu    flu meningitis as infant  . HTN (hypertension)   . Migraines      Social History   Socioeconomic History  . Marital status: Married    Spouse name: Not on file  . Number of children: Not on file  . Years of education: Not on file  . Highest education level: Not on file  Occupational History  . Not on file  Social Needs  . Financial resource strain: Not on file  . Food insecurity    Worry: Not on file    Inability: Not on file  . Transportation needs    Medical: Not on file    Non-medical: Not on file  Tobacco Use  . Smoking status: Never Smoker  . Smokeless tobacco: Never Used  . Tobacco comment: tobacco use - no  Substance and Sexual Activity  . Alcohol use: Yes    Alcohol/week:  0.0 standard drinks  . Drug use: No  . Sexual activity: Yes  Lifestyle  . Physical activity    Days per week: Not on file    Minutes per session: Not on file  . Stress: Not on file  Relationships  . Social Musicianconnections    Talks on phone: Not on file    Gets together: Not on file    Attends religious service: Not on file    Active member of club or organization: Not on file    Attends meetings of clubs or organizations: Not on file    Relationship status: Not on file  . Intimate partner violence    Fear of current or ex partner: Not on file    Emotionally abused: Not on file    Physically abused: Not on file    Forced  sexual activity: Not on file  Other Topics Concern  . Not on file  Social History Narrative   Full time teacher at [employer]; gets regular exercise.     Past Surgical History:  Procedure Laterality Date  . TONSILLECTOMY    . VARICOSE VEIN SURGERY Right     Family History  Problem Relation Age of Onset  . Colon cancer Mother 5532    Allergies  Allergen Reactions  . Amoxicillin-Pot Clavulanate Nausea And Vomiting  . Latex Other (See Comments)  . Propoxyphene Nausea And Vomiting    Current Outpatient Medications on File Prior to Visit  Medication Sig Dispense Refill  . amLODipine (NORVASC) 10 MG tablet Take 1 tablet by mouth once daily for blood pressure. 90 tablet 3  . Choriogonadotropin Alfa (OVIDREL) 250 MCG/0.5ML injection Inject 250 mcg into the skin every 30 (thirty) days.    . clomiPHENE (CLOMID) 50 MG tablet Take 1 tablet (50 mg total) by mouth daily. Take on days 5-9 of your period 5 tablet 3  . letrozole (FEMARA) 2.5 MG tablet Take 5 mg by mouth daily.     . misoprostol (CYTOTEC) 200 MCG tablet Take 2 tablets (400 mcg total) by mouth every 4 (four) hours. Times 4 doses total 8 tablet 1  . Prenat w/o A-FeCbGl-DSS-FA-DHA (CITRANATAL ASSURE) 35-1 & 300 MG tablet Take 1 tablet by mouth daily. 90 tablet 3  . progesterone (PROMETRIUM) 200 MG capsule Place one capsule vaginally at bedtime 30 capsule 3  . sertraline (ZOLOFT) 50 MG tablet Take 100 mg by mouth daily.     . traZODone (DESYREL) 100 MG tablet Take 100-200 mg by mouth at bedtime.     . clonazePAM (KLONOPIN) 0.5 MG tablet Take 0.5 mg by mouth as needed.      No current facility-administered medications on file prior to visit.     Ht 5\' 5"  (1.651 m)   Wt 210 lb (95.3 kg)   LMP 03/15/2019   BMI 34.95 kg/m    Objective:   Physical Exam  Constitutional: She is oriented to person, place, and time. She appears well-nourished.  Respiratory: Effort normal.  Neurological: She is alert and oriented to person,  place, and time.  Psychiatric: She has a normal mood and affect.           Assessment & Plan:

## 2019-04-14 NOTE — Assessment & Plan Note (Signed)
Patient endorses stable readings overall, compliant to amlodipine, continue same.

## 2019-09-12 NOTE — L&D Delivery Note (Signed)
Delivery Note At 12:35 AM a viable female was delivered via Vaginal, Spontaneous (Presentation: Left Occiput Anterior).  APGAR: 8, 9; weight pending.  Placenta status: Manual removal, Intact.  Cord: 3 vessels with the following complications: Tight nuchal x 2.  Cord pH: n/a.  Uterine atony required 1000 mcg rectal cytotec, hemabate 250 mcg, and TXA 1g.  Excellent tone and scant bleeding following medications.    Anesthesia: Epidural Episiotomy: None Lacerations: Vaginal Suture Repair: 3.0 vicryl rapide Est. Blood Loss (mL):  400  Mom to postpartum.  Baby to Couplet care / Skin to Skin.  Mitchel Honour 05/29/2020, 1:39 AM

## 2019-10-02 ENCOUNTER — Telehealth: Payer: Self-pay

## 2019-10-02 ENCOUNTER — Ambulatory Visit: Payer: BC Managed Care – PPO | Admitting: Primary Care

## 2019-10-02 ENCOUNTER — Other Ambulatory Visit: Payer: Self-pay

## 2019-10-02 VITALS — BP 122/100 | HR 91 | Temp 96.6°F | Ht 65.0 in | Wt 204.8 lb

## 2019-10-02 DIAGNOSIS — I10 Essential (primary) hypertension: Secondary | ICD-10-CM

## 2019-10-02 MED ORDER — LABETALOL HCL 100 MG PO TABS: 100.0000 mg | ORAL_TABLET | Freq: Two times a day (BID) | ORAL | 0 refills | Status: DC

## 2019-10-02 NOTE — Patient Instructions (Signed)
Stop Amlodipine 10 mg.   Start labetalol 100 mg twice daily for blood pressure.  Continue to monitor your blood pressure at home daily.  Schedule a follow up visit for 2 weeks for BP check.   It was a pleasure to see you today!

## 2019-10-02 NOTE — Progress Notes (Signed)
Subjective:    Patient ID: Rebecca Orozco, female    DOB: 1981/03/29, 39 y.o.   MRN: [id number]  HPI  This visit occurred during the SARS-CoV-2 public health emergency.  Safety protocols were in place, including screening questions prior to the visit, additional usage of staff PPE, and extensive cleaning of exam room while observing appropriate contact time as indicated for disinfecting solutions.   Rebecca Orozco is a 39 year old female with a history of hypertension, infertility, cervical dysplasia, anxiety/depression who presents today with a chief complaint of hypertension.  She is currently managed on Amlodipine 10 mg daily. She's been checking her BP at home over the last week which has been 128/100, 138/104, 145/113, 135/107.  She's had one headache. She denies dizziness, visual changes. She completed embryo implantation nearly one week ago, she will find out later this week if the embryo implantation was successful.   BP Readings from Last 3 Encounters:  10/02/19 (!) 122/100  10/24/18 96/71  09/13/18 (!) 128/98     Review of Systems  Eyes: Negative for visual disturbance.  Respiratory: Negative for shortness of breath.   Cardiovascular: Negative for chest pain.  Neurological: Positive for headaches. Negative for dizziness.       Past Medical History:  Diagnosis Date  . Anxiety and depression   . Cervical dysplasia   . Flu    flu meningitis as infant  . HTN (hypertension)   . Migraines      Social History   Socioeconomic History  . Marital status: Married    Spouse name: Not on file  . Number of children: Not on file  . Years of education: Not on file  . Highest education level: Not on file  Occupational History  . Not on file  Tobacco Use  . Smoking status: Never Smoker  . Smokeless tobacco: Never Used  . Tobacco comment: tobacco use - no  Substance and Sexual Activity  . Alcohol use: Yes    Alcohol/week: 0.0 standard drinks  . Drug use: No  . Sexual  activity: Yes  Other Topics Concern  . Not on file  Social History Narrative   Full time teacher at [employer]; gets regular exercise.    Social Determinants of Health   Financial Resource Strain:   . Difficulty of Paying Living Expenses: Not on file  Food Insecurity:   . Worried About Charity fundraiser in the Last Year: Not on file  . Ran Out of Food in the Last Year: Not on file  Transportation Needs:   . Lack of Transportation (Medical): Not on file  . Lack of Transportation (Non-Medical): Not on file  Physical Activity:   . Days of Exercise per Week: Not on file  . Minutes of Exercise per Session: Not on file  Stress:   . Feeling of Stress : Not on file  Social Connections:   . Frequency of Communication with Friends and Family: Not on file  . Frequency of Social Gatherings with Friends and Family: Not on file  . Attends Religious Services: Not on file  . Active Member of Clubs or Organizations: Not on file  . Attends Archivist Meetings: Not on file  . Marital Status: Not on file  Intimate Partner Violence:   . Fear of Current or Ex-Partner: Not on file  . Emotionally Abused: Not on file  . Physically Abused: Not on file  . Sexually Abused: Not on file    Past Surgical History:  Procedure Laterality Date  . TONSILLECTOMY    . VARICOSE VEIN SURGERY Right     Family History  Problem Relation Age of Onset  . Colon cancer Mother 71    Allergies  Allergen Reactions  . Amoxicillin-Pot Clavulanate Nausea And Vomiting  . Latex Other (See Comments)  . Propoxyphene Nausea And Vomiting    Current Outpatient Medications on File Prior to Visit  Medication Sig Dispense Refill  . clomiPHENE (CLOMID) 50 MG tablet Take 1 tablet (50 mg total) by mouth daily. Take on days 5-9 of your period 5 tablet 3  . clonazePAM (KLONOPIN) 0.5 MG tablet Take 0.5 mg by mouth as needed.     . misoprostol (CYTOTEC) 200 MCG tablet Take 2 tablets (400 mcg total) by mouth  every 4 (four) hours. Times 4 doses total 8 tablet 1  . Prenat w/o A-FeCbGl-DSS-FA-DHA (CITRANATAL ASSURE) 35-1 & 300 MG tablet Take 1 tablet by mouth daily. 90 tablet 3  . sertraline (ZOLOFT) 50 MG tablet Take 100 mg by mouth daily.     . traZODone (DESYREL) 100 MG tablet Take 100-200 mg by mouth at bedtime.     . DOTTI 0.1 MG/24HR patch SMARTSIG:1 Patch(s) T-DERMAL Every 3 Days    . estradiol (ESTRACE) 2 MG tablet Take 2 mg by mouth 2 (two) times daily.    . progesterone 50 MG/ML injection Inject 50 mg into the muscle daily.     No current facility-administered medications on file prior to visit.    BP (!) 122/100   Pulse 91   Temp (!) 96.6 F (35.9 C) (Temporal)   Ht 5\' 5"  (1.651 m)   Wt 204 lb 12 oz (92.9 kg)   LMP 08/23/2019   SpO2 100%   BMI 34.07 kg/m    Objective:   Physical Exam  Constitutional: She appears well-nourished.  Cardiovascular: Normal rate and regular rhythm.  Respiratory: Effort normal and breath sounds normal.  Musculoskeletal:     Cervical back: Neck supple.  Skin: Skin is warm and dry.  Psychiatric: She has a normal mood and affect.           Assessment & Plan:

## 2019-10-02 NOTE — Telephone Encounter (Signed)
[facility] Night - Client Nonclinical Telephone Record AccessNurse Client [facility] Night - Client Client Site [facility] - Night Physician [facility] - PHYSICIAN, Crissie Figures- MD Contact Type Call Who Is Calling Patient / Member / Family / Caregiver Caller Name Rebecca Orozco Caller Phone Number [phone] Call Type Message Only Information Provided Reason for Call Returning a Call from the Office Initial Comment Caller states they had a missed call from [place]. Additional Comment Office hours provided. Disp. Time Disposition Final User 10/02/2019 7:56:34 AM General Information Provided Yes Donald Siva Call Closed By: Donald Siva Transaction Date/Time: 10/02/2019 7:55:04 AM (ET)

## 2019-10-02 NOTE — Assessment & Plan Note (Signed)
Not controlled on Amlodipine 10 mg.  Now with potential pregnancy dihydropyridine CCB's are not first line.  Stop Amlodipine. Start labetalol 100 mg BID.   We will have her back in the office in 2 weeks for BP check.

## 2019-10-16 ENCOUNTER — Other Ambulatory Visit: Payer: Self-pay

## 2019-10-16 ENCOUNTER — Encounter: Payer: Self-pay | Admitting: Primary Care

## 2019-10-16 ENCOUNTER — Ambulatory Visit: Payer: BC Managed Care – PPO | Admitting: Primary Care

## 2019-10-16 DIAGNOSIS — I10 Essential (primary) hypertension: Secondary | ICD-10-CM | POA: Diagnosis not present

## 2019-10-16 DIAGNOSIS — N979 Female infertility, unspecified: Secondary | ICD-10-CM | POA: Diagnosis not present

## 2019-10-16 MED ORDER — LABETALOL HCL 100 MG PO TABS: 100.0000 mg | ORAL_TABLET | Freq: Two times a day (BID) | ORAL | 2 refills | Status: DC

## 2019-10-16 NOTE — Progress Notes (Signed)
Subjective:    Patient ID: Rebecca Orozco, female    DOB: 08-29-1981, 39 y.o.   MRN: [id number]  HPI  This visit occurred during the SARS-CoV-2 public health emergency.  Safety protocols were in place, including screening questions prior to the visit, additional usage of staff PPE, and extensive cleaning of exam room while observing appropriate contact time as indicated for disinfecting solutions.   Rebecca Orozco is a 39 year old female with a history of infertility, hypertension who presents today for follow up of hypertension.  She was last evaluated a few weeks ago for elevated blood pressure readings despite use of Amlodipine 10 mg. She is also working with fertility specialists for pregnancy. We stopped her Amlodipine 10 mg and initiated labetalol 100 mg BID.   Since her last visit she's doing well. She found out that she is five weeks pregnant, following with fertility specialist for now. She denies chest pain, dizziness, shortness of breath.   BP Readings from Last 3 Encounters:  10/16/19 120/86  10/02/19 (!) 122/100  10/24/18 96/71   Wt Readings from Last 3 Encounters:  10/16/19 211 lb 8 oz (95.9 kg)  10/02/19 204 lb 12 oz (92.9 kg)  04/14/19 210 lb (95.3 kg)     Review of Systems  Respiratory: Negative for shortness of breath.   Cardiovascular: Negative for chest pain.  Neurological: Negative for dizziness and headaches.       Past Medical History:  Diagnosis Date  . Anxiety and depression   . Cervical dysplasia   . Flu    flu meningitis as infant  . HTN (hypertension)   . Migraines      Social History   Socioeconomic History  . Marital status: Married    Spouse name: Not on file  . Number of children: Not on file  . Years of education: Not on file  . Highest education level: Not on file  Occupational History  . Not on file  Tobacco Use  . Smoking status: Never Smoker  . Smokeless tobacco: Never Used  . Tobacco comment: tobacco use - no  Substance and  Sexual Activity  . Alcohol use: Yes    Alcohol/week: 0.0 standard drinks  . Drug use: No  . Sexual activity: Yes  Other Topics Concern  . Not on file  Social History Narrative   Full time teacher at [employer]; gets regular exercise.    Social Determinants of Health   Financial Resource Strain:   . Difficulty of Paying Living Expenses: Not on file  Food Insecurity:   . Worried About Charity fundraiser in the Last Year: Not on file  . Ran Out of Food in the Last Year: Not on file  Transportation Needs:   . Lack of Transportation (Medical): Not on file  . Lack of Transportation (Non-Medical): Not on file  Physical Activity:   . Days of Exercise per Week: Not on file  . Minutes of Exercise per Session: Not on file  Stress:   . Feeling of Stress : Not on file  Social Connections:   . Frequency of Communication with Friends and Family: Not on file  . Frequency of Social Gatherings with Friends and Family: Not on file  . Attends Religious Services: Not on file  . Active Member of Clubs or Organizations: Not on file  . Attends Archivist Meetings: Not on file  . Marital Status: Not on file  Intimate Partner Violence:   . Fear of Current  or Ex-Partner: Not on file  . Emotionally Abused: Not on file  . Physically Abused: Not on file  . Sexually Abused: Not on file    Past Surgical History:  Procedure Laterality Date  . TONSILLECTOMY    . VARICOSE VEIN SURGERY Right     Family History  Problem Relation Age of Onset  . Colon cancer Mother 69    Allergies  Allergen Reactions  . Amoxicillin-Pot Clavulanate Nausea And Vomiting  . Latex Other (See Comments)  . Propoxyphene Nausea And Vomiting    Current Outpatient Medications on File Prior to Visit  Medication Sig Dispense Refill  . clonazePAM (KLONOPIN) 0.5 MG tablet Take 0.5 mg by mouth as needed.     . DOTTI 0.1 MG/24HR patch SMARTSIG:1 Patch(s) T-DERMAL Every 3 Days    . estradiol (ESTRACE) 2 MG  tablet Take 2 mg by mouth 2 (two) times daily.    . Prenat w/o A-FeCbGl-DSS-FA-DHA (CITRANATAL ASSURE) 35-1 & 300 MG tablet Take 1 tablet by mouth daily. 90 tablet 3  . progesterone 50 MG/ML injection Inject 50 mg into the muscle daily.    . sertraline (ZOLOFT) 50 MG tablet Take 100 mg by mouth daily.     . traZODone (DESYREL) 100 MG tablet Take 100-200 mg by mouth at bedtime.      No current facility-administered medications on file prior to visit.    BP 120/86   Pulse 82   Temp (!) 96.4 F (35.8 C) (Temporal)   Ht 5\' 5"  (1.651 m)   Wt 211 lb 8 oz (95.9 kg)   LMP 10/02/2019   SpO2 98%   BMI 35.20 kg/m    Objective:   Physical Exam  Constitutional: She appears well-nourished.  Cardiovascular: Normal rate and regular rhythm.  Respiratory: Effort normal and breath sounds normal.  Musculoskeletal:     Cervical back: Neck supple.  Skin: Skin is warm and dry.  Psychiatric: She has a normal mood and affect.           Assessment & Plan:

## 2019-10-16 NOTE — Patient Instructions (Signed)
Continue labetalol 100 mg twice daily for blood pressure.  Monitor your blood pressure as discussed, update me with any changes.  It was a pleasure to see you today!

## 2019-10-16 NOTE — Assessment & Plan Note (Signed)
Now five weeks pregnant, following with fertility clinic. Congratulated her on this!

## 2019-10-16 NOTE — Assessment & Plan Note (Signed)
Improved and at goal on labetalol 100 mg BID, continue same. She will monitor BP and update for any elevations.

## 2019-12-03 LAB — OB RESULTS CONSOLE RUBELLA ANTIBODY, IGM: Rubella: IMMUNE

## 2019-12-03 LAB — OB RESULTS CONSOLE RPR: RPR: NONREACTIVE

## 2019-12-03 LAB — OB RESULTS CONSOLE GC/CHLAMYDIA
Chlamydia: NEGATIVE
Gonorrhea: NEGATIVE

## 2019-12-03 LAB — OB RESULTS CONSOLE HEPATITIS B SURFACE ANTIGEN: Hepatitis B Surface Ag: NEGATIVE

## 2019-12-08 LAB — RESULTS CONSOLE HPV: CHL HPV: NEGATIVE

## 2019-12-08 LAB — HM PAP SMEAR

## 2019-12-22 ENCOUNTER — Encounter (HOSPITAL_COMMUNITY): Payer: Self-pay | Admitting: *Deleted

## 2019-12-23 ENCOUNTER — Other Ambulatory Visit: Payer: Self-pay

## 2019-12-23 ENCOUNTER — Ambulatory Visit (HOSPITAL_COMMUNITY): Payer: BC Managed Care – PPO | Admitting: *Deleted

## 2019-12-23 ENCOUNTER — Encounter (HOSPITAL_COMMUNITY): Payer: Self-pay

## 2019-12-23 ENCOUNTER — Ambulatory Visit (HOSPITAL_COMMUNITY): Payer: BC Managed Care – PPO | Attending: Obstetrics and Gynecology | Admitting: Maternal & Fetal Medicine

## 2019-12-23 VITALS — BP 122/86 | HR 87 | Temp 97.6°F

## 2019-12-23 DIAGNOSIS — Z3A15 15 weeks gestation of pregnancy: Secondary | ICD-10-CM | POA: Diagnosis not present

## 2019-12-23 DIAGNOSIS — O0990 Supervision of high risk pregnancy, unspecified, unspecified trimester: Secondary | ICD-10-CM

## 2019-12-23 DIAGNOSIS — O10912 Unspecified pre-existing hypertension complicating pregnancy, second trimester: Secondary | ICD-10-CM | POA: Diagnosis not present

## 2019-12-23 DIAGNOSIS — Z7982 Long term (current) use of aspirin: Secondary | ICD-10-CM | POA: Insufficient documentation

## 2019-12-23 DIAGNOSIS — Z79899 Other long term (current) drug therapy: Secondary | ICD-10-CM | POA: Diagnosis not present

## 2019-12-23 DIAGNOSIS — O09522 Supervision of elderly multigravida, second trimester: Secondary | ICD-10-CM | POA: Insufficient documentation

## 2019-12-23 NOTE — Progress Notes (Signed)
MFM Consultation  Date of service: 12/23/19 Reason for request: Chronic hypertension in pregnancy Requesting provider: Burns Spain, DO  Rebecca Orozco is a G2P0 who is here at 15w 2d dated by a first trimester scan and IVF she is  here at the request of Dr. Langston Masker regarding chronic hypertension in pregnancy.  She is overall doing well without complaints. She denies vaginal bleeding loss of fluid or uterine contractions.   Her prenatal care is uncomplicated. She notes she had a normal cell free DNA. She is taking low dose ASA for preeclampsia prevention.  Her pregnancy history is complicated by chronic hypertension of which she has carried the diagnosis for 15 years. She has intermittently been on and off antihypertensive therapy from 10 mg of amlodipine to her current therapy of labetalol 200 mg TID. Her blood pressure is currently stable <140/90, however, Ms. Salo notes that her diastolic blood pressures are usually the challenge in which she reports pressures in the low mid 100's.   There are no documented preeclampsia labs   Vitals with BMI 12/23/2019 10/16/2019 10/02/2019  Height - 5\' 5"  5\' 5"   Weight - 211 lbs 8 oz 204 lbs 12 oz  BMI - 35.2 34.07  Systolic 122 120  Diastolic 86 86 100  Pulse 87 82 91   No flowsheet data found.  CMP Latest Ref Rng & Units 09/22/2016  Glucose 65 - 99 mg/dL 242)  BUN 6 - 20 mg/dL 12  Creatinine 11/20/2016 - 353(I mg/dL 1.44  Sodium 3.15 - 4.00 mmol/L 140  Potassium 3.5 - 5.2 mmol/L 4.4  Chloride 96 - 106 mmol/L 97  CO2 18 - 29 mmol/L 20  Calcium 8.7 - 10.2 mg/dL 9.3  Total Protein 6.0 - 8.5 g/dL 7.7  Total Bilirubin 0.0 - 1.2 mg/dL 0.7  Alkaline Phos 39 - 117 IU/L 51  AST 0 - 40 IU/L 16  ALT 0 - 32 IU/L 15    OB History  Gravida Para Term Preterm AB Living  2       1    SAB TAB Ectopic Multiple Live Births  1            # Outcome Date GA Lbr Len/2nd Weight Sex Delivery Anes PTL Lv  2 Current           1 SAB            Past  Medical History:  Diagnosis Date  . Anxiety and depression   . Cervical dysplasia   . Flu    flu meningitis as infant  . History of meningitis   . HTN (hypertension)   . Migraines   . Vaginal Pap smear, abnormal    Past Surgical History:  Procedure Laterality Date  . TONSILLECTOMY    . VARICOSE VEIN SURGERY Right    Social History   Socioeconomic History  . Marital status: Married    Spouse name: Not on file  . Number of children: Not on file  . Years of education: Not on file  . Highest education level: Not on file  Occupational History  . Not on file  Tobacco Use  . Smoking status: Never Smoker  . Smokeless tobacco: Never Used  . Tobacco comment: tobacco use - no  Substance and Sexual Activity  . Alcohol use: Not Currently    Alcohol/week: 0.0 standard drinks  . Drug use: No  . Sexual activity: Yes  Other Topics Concern  . Not on file  Social History  Narrative   Full time teacher at [employer]; gets regular exercise.    Social Determinants of Health   Financial Resource Strain:   . Difficulty of Paying Living Expenses:   Food Insecurity:   . Worried About Charity fundraiser in the Last Year:   . Arboriculturist in the Last Year:   Transportation Needs:   . Film/video editor (Medical):   Marland Kitchen Lack of Transportation (Non-Medical):   Physical Activity:   . Days of Exercise per Week:   . Minutes of Exercise per Session:   Stress:   . Feeling of Stress :   Social Connections:   . Frequency of Communication with Friends and Family:   . Frequency of Social Gatherings with Friends and Family:   . Attends Religious Services:   . Active Member of Clubs or Organizations:   . Attends Archivist Meetings:   Marland Kitchen Marital Status:   Intimate Partner Violence:   . Fear of Current or Ex-Partner:   . Emotionally Abused:   Marland Kitchen Physically Abused:   . Sexually Abused:    Family History  Problem Relation Age of Onset  . Colon cancer Mother 48   Current  Outpatient Medications on File Prior to Visit  Medication Sig Dispense Refill  . aspirin EC 81 MG tablet Take 81 mg by mouth daily.    . Ondansetron HCl (ZOFRAN PO) Take by mouth.    . labetalol (NORMODYNE) 100 MG tablet Take 1 tablet (100 mg total) by mouth 2 (two) times daily. For blood pressure. 180 tablet 2  . Prenat w/o A-FeCbGl-DSS-FA-DHA (CITRANATAL ASSURE) 35-1 & 300 MG tablet Take 1 tablet by mouth daily. 90 tablet 3   No current facility-administered medications on file prior to visit.     Impression/Counseling:  Chronic hypertension: I discussed with Ms. Kotlyar the complications of chronic hypertension in pregnancy. We discussed the increased risk for fetal growth restriction, superimposed preeclampsia, placental abruption and stillbirth. We discussed the mainstay of preeclampsia prevention in low dose ASA which she is currently taking and I encouraged her to continue until [redacted] weeks gestation.   Secondly, we discussed fetal surveillance to include serial growth exams every 4 weeks, with weekly testing started at 32 weeks. We recommend delivery between 37-39 weeks pending maternal symptoms, IUGR and blood pressure control.  Lastly, we discussed the blood pressure should be maintained <150/105 with dose increased recommended when a pattern at or above this threshold is achieved. Finally, we recommend baseline labs of CBC, CMP and urinary protein/creatinine ratio.  IVF: I explained to Ms. Stlaurent that there is a small increased risk for cardiac defects in pregnancies conceived through IVF, Ms. Hench notes that she has a referral for a fetal echocardiogram submitted.   I spent 30 minutes with > 50% in face to face consultation and care coordination.  All questions answered.  Mical Kicklighter Rolla Etienne, MD.

## 2019-12-30 ENCOUNTER — Encounter (HOSPITAL_COMMUNITY): Payer: Self-pay | Admitting: Obstetrics & Gynecology

## 2019-12-30 ENCOUNTER — Other Ambulatory Visit (HOSPITAL_COMMUNITY): Payer: Self-pay | Admitting: Obstetrics & Gynecology

## 2020-03-11 LAB — OB RESULTS CONSOLE HIV ANTIBODY (ROUTINE TESTING): HIV: NONREACTIVE

## 2020-05-18 ENCOUNTER — Other Ambulatory Visit: Payer: Self-pay

## 2020-05-18 ENCOUNTER — Encounter (HOSPITAL_COMMUNITY): Payer: Self-pay | Admitting: Obstetrics & Gynecology

## 2020-05-18 ENCOUNTER — Observation Stay (HOSPITAL_COMMUNITY)
Admission: AD | Admit: 2020-05-18 | Discharge: 2020-05-19 | Disposition: A | Discharge status: Home / Self Care | Payer: BC Managed Care – PPO | Attending: Obstetrics & Gynecology | Admitting: Obstetrics & Gynecology

## 2020-05-18 DIAGNOSIS — Z79899 Other long term (current) drug therapy: Secondary | ICD-10-CM | POA: Diagnosis not present

## 2020-05-18 DIAGNOSIS — Z7982 Long term (current) use of aspirin: Secondary | ICD-10-CM | POA: Diagnosis not present

## 2020-05-18 DIAGNOSIS — O10013 Pre-existing essential hypertension complicating pregnancy, third trimester: Secondary | ICD-10-CM | POA: Diagnosis not present

## 2020-05-18 DIAGNOSIS — O98513 Other viral diseases complicating pregnancy, third trimester: Secondary | ICD-10-CM | POA: Diagnosis not present

## 2020-05-18 DIAGNOSIS — O10919 Unspecified pre-existing hypertension complicating pregnancy, unspecified trimester: Secondary | ICD-10-CM | POA: Diagnosis present

## 2020-05-18 DIAGNOSIS — O10913 Unspecified pre-existing hypertension complicating pregnancy, third trimester: Secondary | ICD-10-CM

## 2020-05-18 DIAGNOSIS — Z3A36 36 weeks gestation of pregnancy: Secondary | ICD-10-CM | POA: Insufficient documentation

## 2020-05-18 DIAGNOSIS — U071 COVID-19: Secondary | ICD-10-CM | POA: Insufficient documentation

## 2020-05-18 DIAGNOSIS — O09523 Supervision of elderly multigravida, third trimester: Secondary | ICD-10-CM | POA: Insufficient documentation

## 2020-05-18 HISTORY — DX: Unspecified pre-existing hypertension complicating pregnancy, unspecified trimester: O10.919

## 2020-05-18 LAB — COMPREHENSIVE METABOLIC PANEL
ALT: 35 U/L (ref 0–44)
AST: 45 U/L — ABNORMAL HIGH (ref 15–41)
Albumin: 2.8 g/dL — ABNORMAL LOW (ref 3.5–5.0)
Alkaline Phosphatase: 97 U/L (ref 38–126)
Anion gap: 11 (ref 5–15)
BUN: 9 mg/dL (ref 6–20)
CO2: 20 mmol/L — ABNORMAL LOW (ref 22–32)
Calcium: 9.9 mg/dL (ref 8.9–10.3)
Chloride: 103 mmol/L (ref 98–111)
Creatinine, Ser: 0.7 mg/dL (ref 0.44–1.00)
GFR calc Af Amer: >60 mL/min (ref >60)
GFR calc non Af Amer: >60 mL/min (ref >60)
Glucose, Bld: 85 mg/dL (ref 70–99)
Potassium: 4 mmol/L (ref 3.5–5.1)
Sodium: 134 mmol/L — ABNORMAL LOW (ref 135–145)
Total Bilirubin: 0.5 mg/dL (ref 0.3–1.2)
Total Protein: 6 g/dL — ABNORMAL LOW (ref 6.5–8.1)

## 2020-05-18 LAB — CBC
HCT: 33.8 % — ABNORMAL LOW (ref 36.0–46.0)
Hemoglobin: 11.1 g/dL — ABNORMAL LOW (ref 12.0–15.0)
MCH: 30.1 pg (ref 26.0–34.0)
MCHC: 32.8 g/dL (ref 30.0–36.0)
MCV: 91.6 fL (ref 80.0–100.0)
Platelets: 214 10*3/uL (ref 150–400)
RBC: 3.69 MIL/uL — ABNORMAL LOW (ref 3.87–5.11)
RDW: 13.9 % (ref 11.5–15.5)
WBC: 6.2 10*3/uL (ref 4.0–10.5)
nRBC: 0 % (ref 0.0–0.2)

## 2020-05-18 LAB — URINALYSIS, ROUTINE W REFLEX MICROSCOPIC
Bilirubin Urine: NEGATIVE
Glucose, UA: NEGATIVE mg/dL
Hgb urine dipstick: NEGATIVE
Ketones, ur: NEGATIVE mg/dL
Leukocytes,Ua: NEGATIVE
Nitrite: NEGATIVE
Protein, ur: NEGATIVE mg/dL
Specific Gravity, Urine: 1.006 (ref 1.005–1.030)
pH: 5 (ref 5.0–8.0)

## 2020-05-18 LAB — PROTEIN / CREATININE RATIO, URINE
Creatinine, Urine: 42.64 mg/dL
Total Protein, Urine: <6 mg/dL

## 2020-05-18 LAB — OB RESULTS CONSOLE GBS: GBS: NEGATIVE

## 2020-05-18 LAB — TYPE AND SCREEN
ABO/RH(D): A POS
Antibody Screen: NEGATIVE

## 2020-05-18 LAB — SARS CORONAVIRUS 2 BY RT PCR (HOSPITAL ORDER, PERFORMED IN ~~LOC~~ HOSPITAL LAB): SARS Coronavirus 2: POSITIVE — AB

## 2020-05-18 MED ORDER — ACETAMINOPHEN 325 MG PO TABS: 650.0000 mg | ORAL_TABLET | ORAL | Status: DC | PRN

## 2020-05-18 MED ORDER — FAMOTIDINE 20 MG PO TABS
20.0000 mg | ORAL_TABLET | Freq: Every day | ORAL | Status: DC
  Administered 2020-05-19: 20 mg via ORAL
  Filled 2020-05-18: qty 1

## 2020-05-18 MED ORDER — HYDRALAZINE HCL 20 MG/ML IJ SOLN: 10.0000 mg | INTRAMUSCULAR | Status: DC | PRN

## 2020-05-18 MED ORDER — CALCIUM CARBONATE ANTACID 500 MG PO CHEW: 2.0000 | CHEWABLE_TABLET | ORAL | Status: DC | PRN

## 2020-05-18 MED ORDER — FAMOTIDINE IN NACL 20-0.9 MG/50ML-% IV SOLN
20.0000 mg | Freq: Once | INTRAVENOUS | Status: AC
  Administered 2020-05-18: 20 mg via INTRAVENOUS
  Filled 2020-05-18: qty 50

## 2020-05-18 MED ORDER — BETAMETHASONE SOD PHOS & ACET 6 (3-3) MG/ML IJ SUSP
12.0000 mg | INTRAMUSCULAR | Status: AC
  Administered 2020-05-18 – 2020-05-19 (×2): 12 mg via INTRAMUSCULAR
  Filled 2020-05-18 (×2): qty 5

## 2020-05-18 MED ORDER — LACTATED RINGERS IV SOLN: Freq: Once | INTRAVENOUS | Status: DC

## 2020-05-18 MED ORDER — LABETALOL HCL 200 MG PO TABS
200.0000 mg | ORAL_TABLET | Freq: Two times a day (BID) | ORAL | Status: DC
  Administered 2020-05-18 – 2020-05-19 (×2): 200 mg via ORAL
  Filled 2020-05-18 (×2): qty 1

## 2020-05-18 MED ORDER — PRENATAL MULTIVITAMIN CH: 1.0000 | ORAL_TABLET | Freq: Every day | ORAL | Status: DC

## 2020-05-18 MED ORDER — LABETALOL HCL 5 MG/ML IV SOLN: 80.0000 mg | INTRAVENOUS | Status: DC | PRN

## 2020-05-18 MED ORDER — ONDANSETRON HCL 4 MG/2ML IJ SOLN
4.0000 mg | Freq: Once | INTRAMUSCULAR | Status: AC
  Administered 2020-05-18: 4 mg via INTRAVENOUS
  Filled 2020-05-18: qty 2

## 2020-05-18 MED ORDER — DOCUSATE SODIUM 100 MG PO CAPS
100.0000 mg | ORAL_CAPSULE | Freq: Every day | ORAL | Status: DC
  Administered 2020-05-19: 100 mg via ORAL
  Filled 2020-05-18: qty 1

## 2020-05-18 MED ORDER — LABETALOL HCL 5 MG/ML IV SOLN
40.0000 mg | INTRAVENOUS | Status: DC | PRN
  Filled 2020-05-18: qty 8

## 2020-05-18 MED ORDER — ZOLPIDEM TARTRATE 5 MG PO TABS
5.0000 mg | ORAL_TABLET | Freq: Every evening | ORAL | Status: DC | PRN
  Administered 2020-05-18: 5 mg via ORAL
  Filled 2020-05-18: qty 1

## 2020-05-18 MED ORDER — LABETALOL HCL 5 MG/ML IV SOLN
20.0000 mg | INTRAVENOUS | Status: DC | PRN
  Administered 2020-05-18: 20 mg via INTRAVENOUS
  Filled 2020-05-18: qty 4

## 2020-05-18 NOTE — H&P (Signed)
Rebecca Orozco is a 39 y.o. female G2P0010 at [redacted]w[redacted]d presented to MAU from office for BP evaluation; 154/100 and 162/110 there.  Patient has h/o chronic hypertension and has taken labetalol 200 BID this pregnancy with low dose ASA. Last u/s in office on 9/1 with normal growth and AFI. She reports no HA, vision change, RUQ pain, CP/SOB.  She has noticed more nausea and vomiting in recent days but seems associated with meals.  She does not taken medication for GERD.  Patient is compliant with her medication and reports no recent change in activity or at work.  In MAU, labs are wnl and negative protein.  She required IV labetalol 20 mg to drop her BPs into mild range.  Antepartum course complicated by AMA with PGS on IVF pregnancy.  Patient also has anxiety and depression which have been stable on no medication.    OB History    Gravida  2   Para      Term      Preterm      AB  1   Living        SAB  1   TAB      Ectopic      Multiple      Live Births             Past Medical History:  Diagnosis Date  . Anxiety and depression   . Cervical dysplasia   . Flu    flu meningitis as infant  . History of meningitis   . HTN (hypertension)   . Migraines   . Vaginal Pap smear, abnormal    Past Surgical History:  Procedure Laterality Date  . TONSILLECTOMY    . VARICOSE VEIN SURGERY Right    Family History: family history includes Colon cancer (age of onset: 52) in her mother. Social History:  reports that she has never smoked. She has never used smokeless tobacco. She reports previous alcohol use. She reports that she does not use drugs.     Maternal Diabetes: No Genetic Screening: Normal Maternal Ultrasounds/Referrals: Normal Fetal Ultrasounds or other Referrals:  None Maternal Substance Abuse:  No Significant Maternal Medications:  Meds include: Other:  labetalol Significant Maternal Lab Results:  Group B Strep negative Other Comments:  None  Review of Systems Maternal  Medical History:  Fetal activity: Perceived fetal activity is normal.   Last perceived fetal movement was within the past hour.    Prenatal complications: PIH.   Prenatal Complications - Diabetes: none.      Blood pressure (!) 147/84, pulse 75, temperature 98.7 F (37.1 C), temperature source Oral, resp. rate 18, height 5\' 6"  (1.676 m), weight 117.3 kg, last menstrual period 09/07/2019, SpO2 99 %, unknown if currently breastfeeding. Maternal Exam:  Abdomen: Patient reports no abdominal tenderness. Fundal height is c/w dates.       Fetal Exam Fetal Monitor Review: Baseline rate: 140.  Variability: moderate (6-25 bpm).   Pattern: accelerations present and no decelerations.    Fetal State Assessment: Category I - tracings are normal.     Physical Exam Constitutional:      Appearance: Normal appearance.  HENT:     Head: Normocephalic and atraumatic.  Pulmonary:     Effort: Pulmonary effort is normal.  Abdominal:     Palpations: Abdomen is soft.  Musculoskeletal:        General: Normal range of motion.     Cervical back: Normal range of motion.  Skin:  General: Skin is warm and dry.  Neurological:     Mental Status: She is alert.  Psychiatric:        Mood and Affect: Mood normal.        Behavior: Behavior normal.     Prenatal labs: ABO, Rh: --/--/A POS (09/07 1900) Antibody: NEG (09/07 1900) Rubella:  Immune RPR:   NR HBsAg:   Negative HIV:   NR GBS:   Negative  Assessment/Plan: 38yo G2P0010 at [redacted]w[redacted]d with CHTN; exacerbation vs super-imposed pre-eclampsia -Continue labetalol 200 BID for now; increase prn -BMZ -SCDs -Add pepcid -Repeat labs in AM -Serial BPs -Continuous fetal monitoring for now  Mitchel Honour 05/18/2020, 9:49 PM

## 2020-05-18 NOTE — MAU Provider Note (Signed)
History     CSN: [id number]  Arrival date and time: 05/18/20 1732   First Provider Initiated Contact with Patient 05/18/20 1826      Chief Complaint  Patient presents with   Skin Ulcer   HPI Rebecca Orozco is a 39 y.o. G2P0010 at [redacted]w[redacted]d who presents to MAU from [facility] clinic for evaluation of elevated blood pressures in the setting of Chronic Hypertension. She manages her blood pressure with Labetalol 200mg  BID, typically taken at 0645 and 2100. Patient also takes a baby Aspirin at night.  She denies headache, visual disturbances, RUQ/epigastric pain, new onset swelling or weight gain. She denies vaginal bleeding, leaking of fluid, decreased fetal movement, fever, falls, or recent illness. She has been NPO since about noon today.  She receives care with [facility].  OB History    Gravida  2   Para      Term      Preterm      AB  1   Living        SAB  1   TAB      Ectopic      Multiple      Live Births              Past Medical History:  Diagnosis Date   Anxiety and depression    Cervical dysplasia    Flu    flu meningitis as infant   History of meningitis    HTN (hypertension)    Migraines    Vaginal Pap smear, abnormal     Past Surgical History:  Procedure Laterality Date   TONSILLECTOMY     VARICOSE VEIN SURGERY Right     Family History  Problem Relation Age of Onset   Colon cancer Mother 63    Social History   Tobacco Use   Smoking status: Never Smoker   Smokeless tobacco: Never Used   Tobacco comment: tobacco use - no  Vaping Use   Vaping Use: Never used  Substance Use Topics   Alcohol use: Not Currently    Alcohol/week: 0.0 standard drinks   Drug use: No    Allergies:  Allergies  Allergen Reactions   Amoxicillin-Pot Clavulanate Nausea And Vomiting   Latex Other (See Comments)   Propoxyphene Nausea And Vomiting    Medications Prior to Admission  Medication Sig Dispense Refill  Last Dose   aspirin EC 81 MG tablet Take 81 mg by mouth daily.   05/17/2020 at Unknown time   labetalol (NORMODYNE) 100 MG tablet Take 1 tablet (100 mg total) by mouth 2 (two) times daily. For blood pressure. 180 tablet 2 05/18/2020 at Unknown time   Prenat w/o A-[id number]-[id number]-FA-DHA (CITRANATAL ASSURE) 35-1 & 300 MG tablet Take 1 tablet by mouth daily. 90 tablet 3 05/18/2020 at Unknown time   Ondansetron HCl (ZOFRAN PO) Take by mouth.      Pyridoxine HCl (B-6 PO) Take by mouth.       Review of Systems  Constitutional: Negative for fatigue and fever.  Eyes: Negative for visual disturbance.  Respiratory: Negative for shortness of breath.   Gastrointestinal: Negative for abdominal pain.  Genitourinary: Negative for vaginal bleeding, vaginal discharge and vaginal pain.  Musculoskeletal: Negative for back pain.  Neurological: Negative for syncope and headaches.  All other systems reviewed and are negative.  Physical Exam   Blood pressure (!) 165/98, pulse 91, temperature 98.7 F (37.1 C), resp. rate 18, height 5\' 6"  (1.676 m), weight 117.3 kg,  last menstrual period 09/07/2019, SpO2 99 %, unknown if currently breastfeeding.  Physical Exam Vitals and nursing note reviewed. Exam conducted with a chaperone present.  Cardiovascular:     Rate and Rhythm: Normal rate.  Pulmonary:     Effort: Pulmonary effort is normal.     Breath sounds: Normal breath sounds.  Abdominal:     Tenderness: There is no abdominal tenderness. There is no right CVA tenderness or left CVA tenderness.     Comments: Gravid  Skin:    General: Skin is warm and dry.     Capillary Refill: Capillary refill takes less than 2 seconds.  Neurological:     General: No focal deficit present.     Mental Status: She is alert.     MAU Course  Procedures  --Reactive tracing: baseline 130, mod var, + 15 x 15 accels, no decels --Labetalol Protocol ordered after initial severe range BP. Given after second severe range per MAU  protocol --Severe range BP resolved after Labetalol 20 mg --No severe symptom in MAU --HPI, labs and recommendation for admission discussed with Dr. Shawnie Pons, who agrees with my plan of care --Report called to Dr. Langston Masker, who agrees to admit to [facility]  Patient Vitals for the past 24 hrs:  BP Temp Pulse Resp SpO2 Height Weight  05/18/20 2040 -- -- -- -- 96 % -- --  05/18/20 2035 -- -- -- -- 98 % -- --  05/18/20 2031 (!) 154/86 -- 88 -- -- -- --  05/18/20 2030 -- -- -- -- 99 % -- --  05/18/20 2026 -- -- -- -- 98 % -- --  05/18/20 2025 -- -- -- -- 98 % -- --  05/18/20 2020 (!) 153/90 -- 89 -- 98 % -- --  05/18/20 2015 -- -- -- -- 97 % -- --  05/18/20 2010 -- -- -- -- 98 % -- --  05/18/20 1946 (!) 136/92 -- 89 -- -- -- --  05/18/20 1929 (!) 130/94 -- 91 -- -- -- --  05/18/20 1915 -- -- -- -- 97 % -- --  05/18/20 1913 (!) 146/90 -- 84 -- -- -- --  05/18/20 1910 -- -- -- -- 98 % -- --  05/18/20 1846 (!) 165/96 -- 95 -- -- -- --  05/18/20 1831 (!) 163/96 -- 93 -- -- -- --  05/18/20 1830 -- -- -- -- 99 % -- --  05/18/20 1829 (!) 165/98 -- 91 -- -- -- --  05/18/20 1825 -- -- -- -- 98 % -- --  05/18/20 1820 -- -- -- -- 98 % -- --  05/18/20 1815 -- -- -- -- 98 % -- --  05/18/20 1813 (!) 150/112 -- (!) 106 -- -- -- --  05/18/20 1805 -- 98.7 F (37.1 C) -- 18 -- 5\' 6"  (1.676 m) 117.3 kg   Orders Placed This Encounter  Procedures   Urinalysis, Routine w reflex microscopic Urine, Clean Catch   CBC   Comprehensive metabolic panel   Protein / creatinine ratio, urine   Diet NPO time specified   Notify Physician   Measure blood pressure   Results for orders placed or performed during the hospital encounter of 05/18/20 (from the past 24 hour(s))  Urinalysis, Routine w reflex microscopic Urine, Clean Catch     Status: Abnormal   Collection Time: 05/18/20  6:22 PM  Result Value Ref Range   Color, Urine YELLOW YELLOW   APPearance HAZY (A) CLEAR   Specific Gravity, Urine 1.006 1.005 -  1.030   pH 5.0 5.0 - 8.0   Glucose, UA NEGATIVE NEGATIVE mg/dL   Hgb urine dipstick NEGATIVE NEGATIVE   Bilirubin Urine NEGATIVE NEGATIVE   Ketones, ur NEGATIVE NEGATIVE mg/dL   Protein, ur NEGATIVE NEGATIVE mg/dL   Nitrite NEGATIVE NEGATIVE   Leukocytes,Ua NEGATIVE NEGATIVE  CBC     Status: Abnormal   Collection Time: 05/18/20  6:27 PM  Result Value Ref Range   WBC 6.2 4.0 - 10.5 K/uL   RBC 3.69 (L) 3.87 - 5.11 MIL/uL   Hemoglobin 11.1 (L) 12.0 - 15.0 g/dL   HCT 83.2 (L) 36 - 46 %   MCV 91.6 80.0 - 100.0 fL   MCH 30.1 26.0 - 34.0 pg   MCHC 32.8 30.0 - 36.0 g/dL   RDW 54.9 82.6 - 41.5 %   Platelets 214 150 - 400 K/uL   nRBC 0.0 0.0 - 0.2 %  Comprehensive metabolic panel     Status: Abnormal   Collection Time: 05/18/20  6:27 PM  Result Value Ref Range   Sodium 134 (L) 135 - 145 mmol/L   Potassium 4.0 3.5 - 5.1 mmol/L   Chloride 103 98 - 111 mmol/L   CO2 20 (L) 22 - 32 mmol/L   Glucose, Bld 85 70 - 99 mg/dL   BUN 9 6 - 20 mg/dL   Creatinine, Ser 8.30 0.44 - 1.00 mg/dL   Calcium 9.9 8.9 - 94.0 mg/dL   Total Protein 6.0 (L) 6.5 - 8.1 g/dL   Albumin 2.8 (L) 3.5 - 5.0 g/dL   AST 45 (H) 15 - 41 U/L   ALT 35 0 - 44 U/L   Alkaline Phosphatase 97 38 - 126 U/L   Total Bilirubin 0.5 0.3 - 1.2 mg/dL   GFR calc non Af Amer >60 >60 mL/min   GFR calc Af Amer >60 >60 mL/min   Anion gap 11 5 - 15  Protein / creatinine ratio, urine     Status: None   Collection Time: 05/18/20  6:27 PM  Result Value Ref Range   Creatinine, Urine 42.64 mg/dL   Total Protein, Urine <6 mg/dL   Protein Creatinine Ratio        0.00 - 0.15 mg/mg[Cre]   Assessment and Plan  --39 y.o. G2P0010 at [redacted]w[redacted]d  --Reactive tracing --Concern for Chronic Hypertension, new onset severe range blood pressures --Normal PEC labs --Per Dr. Langston Masker, admit to [facility], CNM 05/18/2020, 8:53 PM

## 2020-05-18 NOTE — MAU Note (Signed)
Pt sent from doctor's office for blood pressure eval. Denies headache, RUQ pain or vision changes. Denies cntrx, LOF or VB.

## 2020-05-19 LAB — CBC
HCT: 32.7 % — ABNORMAL LOW (ref 36.0–46.0)
Hemoglobin: 10.6 g/dL — ABNORMAL LOW (ref 12.0–15.0)
MCH: 29.7 pg (ref 26.0–34.0)
MCHC: 32.4 g/dL (ref 30.0–36.0)
MCV: 91.6 fL (ref 80.0–100.0)
Platelets: 204 10*3/uL (ref 150–400)
RBC: 3.57 MIL/uL — ABNORMAL LOW (ref 3.87–5.11)
RDW: 14.1 % (ref 11.5–15.5)
WBC: 6 10*3/uL (ref 4.0–10.5)
nRBC: 0 % (ref 0.0–0.2)

## 2020-05-19 LAB — COMPREHENSIVE METABOLIC PANEL
ALT: 35 U/L (ref 0–44)
AST: 35 U/L (ref 15–41)
Albumin: 2.7 g/dL — ABNORMAL LOW (ref 3.5–5.0)
Alkaline Phosphatase: 92 U/L (ref 38–126)
Anion gap: 10 (ref 5–15)
BUN: 10 mg/dL (ref 6–20)
CO2: 20 mmol/L — ABNORMAL LOW (ref 22–32)
Calcium: 8.8 mg/dL — ABNORMAL LOW (ref 8.9–10.3)
Chloride: 105 mmol/L (ref 98–111)
Creatinine, Ser: 0.87 mg/dL (ref 0.44–1.00)
GFR calc Af Amer: >60 mL/min (ref >60)
GFR calc non Af Amer: >60 mL/min (ref >60)
Glucose, Bld: 111 mg/dL — ABNORMAL HIGH (ref 70–99)
Potassium: 4 mmol/L (ref 3.5–5.1)
Sodium: 135 mmol/L (ref 135–145)
Total Bilirubin: 0.4 mg/dL (ref 0.3–1.2)
Total Protein: 5.7 g/dL — ABNORMAL LOW (ref 6.5–8.1)

## 2020-05-19 NOTE — Progress Notes (Signed)
Pharmacy COVID-19 Monoclonal Antibody Screening  Rebecca Orozco was identified as being not hospitalized with symptoms from Covid-19 on admission but an incidental positive PCR has been documented.  The patient may qualify for the use of monoclonal antibodies (mAB) for COVID-19 viral infection to prevent worsening symptoms stemming from Covid-19 infection.  The patient was identified based on a positive COVID-19 PCR and not requiring the use of supplemental oxygen at this time.  This patient meets the FDA criteria for Emergency Use Authorization of casirivimab/imdevimab.  Has a (+) direct SARS-CoV-2 viral test result  Is NOT hospitalized due to COVID-19  Is within 10 days of symptom onset  Has at least one of the high risk factor(s) for progression to severe COVID-19 and/or hospitalization as defined in EUA.  Specific high risk criteria : BMI > 25 and Pregnancy  Additionally: The patient has not had a positive COVID-19 PCR in the last 90 days.  The patient is unvaccinated against COVID-19.  Since the patient is unvaccinated and meets high risk criteria, the patient is eligible for mAB administration.   This eligibility and indication for treatment was discussed with the patient's physician: Dr. Belva Agee  Plan: Based on the above discussion, it was decided that the patient will NOT receive a dose of mAB combination.  Pt refused.   [name] 05/19/2020  9:33 AM

## 2020-05-19 NOTE — Discharge Summary (Addendum)
Physician Discharge Summary  Patient ID: Rebecca Orozco MRN: [id number] DOB/AGE: [birth year] 39 y.o.  Admit date: 05/18/2020 Discharge date: 05/19/2020  Admission Diagnoses: elevated blood pressure  Discharge Diagnoses:  Active Problems:   Chronic hypertension affecting pregnancy   Discharged Condition: good  Hospital Course:  39 yo G2P0 admitted at 61.2 wga presented with BP elevation found to likely have exacerbation of cHTN in addition to incidentally testing positive for COVID. She required one dose of IV Labetalol 20mg  with resolution of BP elevation. Her Labetalol 200mg  BID was continued and did not need to be increased. Her PIH labs were essentially WNL and she has no pronteinuria. She has no s/s of severe disease. Her baby has looked great on cefm. She has had normal to mild range BP elevation since admission. Strongly suspect exacerbation of cHTN. In terms of being COVID positive, patient is concerned about a false positive. She plans to be tested as an outpatient with a PCR.She received one dose of BMZ yesterday and the second dose was given early today as patient strongly desired am discharge.   Consults: None  Significant Diagnostic Studies: labs:  CBC    Component Value Date/Time   WBC 6.0 05/19/2020 0659   RBC 3.57 (L) 05/19/2020 0659   HGB 10.6 (L) 05/19/2020 0659   HCT 32.7 (L) 05/19/2020 0659   PLT 204 05/19/2020 0659   MCV 91.6 05/19/2020 0659   MCH 29.7 05/19/2020 0659   MCHC 32.4 05/19/2020 0659   RDW 14.1 05/19/2020 0659   CMP     Component Value Date/Time   NA 135 05/19/2020 0659   NA 140 09/22/2016 1116   K 4.0 05/19/2020 0659   CL 105 05/19/2020 0659   CO2 20 (L) 05/19/2020 0659   GLUCOSE 111 (H) 05/19/2020 0659   BUN 10 05/19/2020 0659   BUN 12 09/22/2016 1116   CREATININE 0.87 05/19/2020 0659   CALCIUM 8.8 (L) 05/19/2020 0659   PROT 5.7 (L) 05/19/2020 0659   PROT 7.7 09/22/2016 1116   ALBUMIN 2.7 (L) 05/19/2020 0659   ALBUMIN 5.0 09/22/2016 1116    AST 35 05/19/2020 0659   ALT 35 05/19/2020 0659   ALKPHOS 92 05/19/2020 0659   BILITOT 0.4 05/19/2020 0659   BILITOT 0.7 09/22/2016 1116   GFRNONAA >60 05/19/2020 0659   GFRAA >60 05/19/2020 0659     Treatments: Labetalol  Discharge Exam: Blood pressure 131/75, pulse 83, temperature 98.7 F (37.1 C), temperature source Oral, resp. rate 18, height 5\' 6"  (1.676 m), weight 117.3 kg, last menstrual period 09/07/2019, SpO2 98 %, unknown if currently breastfeeding. General appearance: alert and cooperative Resp: NWOB Cardio: regular rate and rhythm GI: soft, non-tender; bowel sounds normal; no masses,  no organomegaly Extremities: extremities normal, atraumatic, no cyanosis or edema Skin: Skin color, texture, turgor normal. No rashes or lesions  Disposition: Discharge disposition: 01-Home or Self Care       Discharge Instructions    Discharge activity:  No Restrictions   Complete by: As directed    Discharge diet:  No restrictions   Complete by: As directed    Discharge instructions   Complete by: As directed    To take BP at home three times a day. If greater than 160/110, call office.  Keep follow up appointment. In the office.   No sexual activity restrictions   Complete by: As directed    Notify physician for a general feeling that "something is not right"   Complete by: As directed  Notify physician for increase or change in vaginal discharge   Complete by: As directed    Notify physician for intestinal cramps, with or without diarrhea, sometimes described as "gas pain"   Complete by: As directed    Notify physician for leaking of fluid   Complete by: As directed    Notify physician for low, dull backache, unrelieved by heat or Tylenol   Complete by: As directed    Notify physician for menstrual like cramps   Complete by: As directed    Notify physician for pelvic pressure   Complete by: As directed    Notify physician for uterine contractions.  These may be  painless and feel like the uterus is tightening or the baby is  "balling up"   Complete by: As directed    Notify physician for vaginal bleeding   Complete by: As directed    PRETERM LABOR:  Includes any of the follwing symptoms that occur between 20 - [redacted] weeks gestation.  If these symptoms are not stopped, preterm labor can result in preterm delivery, placing your baby at risk   Complete by: As directed      Allergies as of 05/19/2020      Reactions   Amoxicillin-pot Clavulanate Nausea And Vomiting   Latex Other (See Comments)   Propoxyphene Nausea And Vomiting      Medication List    TAKE these medications   aspirin EC 81 MG tablet Take 81 mg by mouth daily.   CitraNatal Assure 35-1 & 300 MG tablet Take 1 tablet by mouth daily.   fluticasone 50 MCG/ACT nasal spray Commonly known as: FLONASE Place 1-2 sprays into both nostrils daily as needed for allergies or rhinitis.   labetalol 200 MG tablet Commonly known as: NORMODYNE Take 200 mg by mouth 2 (two) times daily. What changed: Another medication with the same name was removed. Continue taking this medication, and follow the directions you see here.      STRICT precautions were d/w patient in detail. She is very reliable.   Signed: Ranae Pila 05/19/2020, 9:52 AM

## 2020-05-21 ENCOUNTER — Inpatient Hospital Stay (HOSPITAL_COMMUNITY): Payer: BC Managed Care – PPO

## 2020-05-23 ENCOUNTER — Inpatient Hospital Stay (HOSPITAL_COMMUNITY)
Admission: AD | Admit: 2020-05-23 | Discharge: 2020-05-23 | Disposition: A | Discharge status: Home / Self Care | Payer: BC Managed Care – PPO | Attending: Obstetrics and Gynecology | Admitting: Obstetrics and Gynecology

## 2020-05-23 ENCOUNTER — Encounter (HOSPITAL_COMMUNITY): Payer: Self-pay | Admitting: Obstetrics and Gynecology

## 2020-05-23 ENCOUNTER — Other Ambulatory Visit: Payer: Self-pay

## 2020-05-23 DIAGNOSIS — Z3689 Encounter for other specified antenatal screening: Secondary | ICD-10-CM

## 2020-05-23 DIAGNOSIS — O10919 Unspecified pre-existing hypertension complicating pregnancy, unspecified trimester: Secondary | ICD-10-CM

## 2020-05-23 DIAGNOSIS — O163 Unspecified maternal hypertension, third trimester: Secondary | ICD-10-CM

## 2020-05-23 DIAGNOSIS — Z7982 Long term (current) use of aspirin: Secondary | ICD-10-CM | POA: Insufficient documentation

## 2020-05-23 DIAGNOSIS — Z3A37 37 weeks gestation of pregnancy: Secondary | ICD-10-CM | POA: Diagnosis not present

## 2020-05-23 DIAGNOSIS — O368130 Decreased fetal movements, third trimester, not applicable or unspecified: Secondary | ICD-10-CM | POA: Insufficient documentation

## 2020-05-23 DIAGNOSIS — U071 COVID-19: Secondary | ICD-10-CM | POA: Diagnosis not present

## 2020-05-23 DIAGNOSIS — O10913 Unspecified pre-existing hypertension complicating pregnancy, third trimester: Secondary | ICD-10-CM | POA: Insufficient documentation

## 2020-05-23 DIAGNOSIS — Z880 Allergy status to penicillin: Secondary | ICD-10-CM | POA: Diagnosis not present

## 2020-05-23 DIAGNOSIS — Z79899 Other long term (current) drug therapy: Secondary | ICD-10-CM | POA: Insufficient documentation

## 2020-05-23 LAB — COMPREHENSIVE METABOLIC PANEL
ALT: 27 U/L (ref 0–44)
AST: 26 U/L (ref 15–41)
Albumin: 2.3 g/dL — ABNORMAL LOW (ref 3.5–5.0)
Alkaline Phosphatase: 90 U/L (ref 38–126)
Anion gap: 9 (ref 5–15)
BUN: 9 mg/dL (ref 6–20)
CO2: 20 mmol/L — ABNORMAL LOW (ref 22–32)
Calcium: 8 mg/dL — ABNORMAL LOW (ref 8.9–10.3)
Chloride: 106 mmol/L (ref 98–111)
Creatinine, Ser: 0.82 mg/dL (ref 0.44–1.00)
GFR calc Af Amer: >60 mL/min (ref >60)
GFR calc non Af Amer: >60 mL/min (ref >60)
Glucose, Bld: 89 mg/dL (ref 70–99)
Potassium: 3.6 mmol/L (ref 3.5–5.1)
Sodium: 135 mmol/L (ref 135–145)
Total Bilirubin: 0.3 mg/dL (ref 0.3–1.2)
Total Protein: 5 g/dL — ABNORMAL LOW (ref 6.5–8.1)

## 2020-05-23 LAB — URINALYSIS, MICROSCOPIC (REFLEX)

## 2020-05-23 LAB — CBC
HCT: 33.3 % — ABNORMAL LOW (ref 36.0–46.0)
Hemoglobin: 11.1 g/dL — ABNORMAL LOW (ref 12.0–15.0)
MCH: 30.3 pg (ref 26.0–34.0)
MCHC: 33.3 g/dL (ref 30.0–36.0)
MCV: 91 fL (ref 80.0–100.0)
Platelets: 157 10*3/uL (ref 150–400)
RBC: 3.66 MIL/uL — ABNORMAL LOW (ref 3.87–5.11)
RDW: 13.7 % (ref 11.5–15.5)
WBC: 5.6 10*3/uL (ref 4.0–10.5)
nRBC: 0 % (ref 0.0–0.2)

## 2020-05-23 LAB — URINALYSIS, ROUTINE W REFLEX MICROSCOPIC
Bilirubin Urine: NEGATIVE
Glucose, UA: NEGATIVE mg/dL
Hgb urine dipstick: NEGATIVE
Ketones, ur: NEGATIVE mg/dL
Nitrite: NEGATIVE
Protein, ur: NEGATIVE mg/dL
Specific Gravity, Urine: 1.025 (ref 1.005–1.030)
pH: 6 (ref 5.0–8.0)

## 2020-05-23 LAB — PROTEIN / CREATININE RATIO, URINE
Creatinine, Urine: 208.74 mg/dL
Protein Creatinine Ratio: 0.15 mg/mg{Cre} (ref 0.00–0.15)
Total Protein, Urine: 32 mg/dL

## 2020-05-23 MED ORDER — LABETALOL HCL 100 MG PO TABS
200.0000 mg | ORAL_TABLET | Freq: Once | ORAL | Status: AC
  Administered 2020-05-23: 200 mg via ORAL
  Filled 2020-05-23: qty 2

## 2020-05-23 NOTE — MAU Note (Signed)
Rebecca Orozco is a 39 y.o. at [redacted]w[redacted]d here in MAU reporting: hypertension for the last three days, feels decreased FM Denies VB/LOF/

## 2020-05-23 NOTE — MAU Provider Note (Addendum)
History     CSN: [id number]  Arrival date and time: 05/23/20 6144   First Provider Initiated Contact with Patient 05/23/20 [phone]      Chief Complaint  Patient presents with  . Hypertension  . Decreased Fetal Movement   Rebecca Orozco is a 39 y.o. G2P0010 at [redacted]w[redacted]d who receives care at [facility].  She presents today for Hypertension and Decreased Fetal Movement.  Patient reports she has been having elevated blood pressure since 2am this morning.  She reports that she has been taking her blood pressure every 2 hours this morning and has been taking it "multiple times per day" since her discharge on Wednesday 9/08.  She reports at 0530 her blood pressure was 171/120 and the repeat 165/103.  Patient reports she takes her labetalol (200mg ) at 7am and 7pm. She endorses compliance with medication.  She denies headache, visual disturbances, SOB, edema, and RUQ pain.  Patient also reports DFM in "the last day or so."  She contributes this to advancement in pregnancy and report she has tried to increase movement by "touching my belly and drinking cold drinks."  She reports she does experience movement when she lays on her left side.     OB History    Gravida  2   Para      Term      Preterm      AB  1   Living        SAB  1   TAB      Ectopic      Multiple      Live Births              Past Medical History:  Diagnosis Date  . Anxiety and depression   . Cervical dysplasia   . Flu    flu meningitis as infant  . History of meningitis   . HTN (hypertension)   . Migraines   . Vaginal Pap smear, abnormal     Past Surgical History:  Procedure Laterality Date  . TONSILLECTOMY    . VARICOSE VEIN SURGERY Right     Family History  Problem Relation Age of Onset  . Colon cancer Mother 67    Social History   Tobacco Use  . Smoking status: Never Smoker  . Smokeless tobacco: Never Used  . Tobacco comment: tobacco use - no  Vaping Use  . Vaping Use: Never used   Substance Use Topics  . Alcohol use: Not Currently    Alcohol/week: 0.0 standard drinks  . Drug use: No    Allergies:  Allergies  Allergen Reactions  . Amoxicillin-Pot Clavulanate Nausea And Vomiting  . Latex Other (See Comments)  . Propoxyphene Nausea And Vomiting    Medications Prior to Admission  Medication Sig Dispense Refill Last Dose  . labetalol (NORMODYNE) 200 MG tablet Take 200 mg by mouth 2 (two) times daily.   05/22/2020 at Unknown time  . Prenat w/o A-[id number]-[id number]-DHA (CITRANATAL ASSURE) 35-1 & 300 MG tablet Take 1 tablet by mouth daily. 90 tablet 3 Past Week at Unknown time  . aspirin EC 81 MG tablet Take 81 mg by mouth daily.     . fluticasone (FLONASE) 50 MCG/ACT nasal spray Place 1-2 sprays into both nostrils daily as needed for allergies or rhinitis.       Review of Systems  Constitutional: Negative for chills and fever.  Eyes: Negative for visual disturbance.  Respiratory: Positive for cough. Negative for shortness of breath.  Gastrointestinal: Positive for nausea. Negative for abdominal pain and vomiting.  Genitourinary: Negative for pelvic pain, vaginal bleeding and vaginal discharge.  Musculoskeletal: Negative for back pain.  Neurological: Negative for dizziness, light-headedness and headaches.   Physical Exam   Blood pressure (!) 137/97, pulse 91, temperature 98.1 F (36.7 C), temperature source Oral, resp. rate 18, weight 117.3 kg, last menstrual period 09/07/2019, SpO2 97 %, unknown if currently breastfeeding.   Physical Exam Constitutional:      Appearance: Normal appearance. She is obese.  HENT:     Head: Normocephalic and atraumatic.  Eyes:     Conjunctiva/sclera: Conjunctivae normal.  Cardiovascular:     Rate and Rhythm: Normal rate and regular rhythm.     Heart sounds: Normal heart sounds.  Pulmonary:     Effort: Pulmonary effort is normal.     Breath sounds: Normal breath sounds.  Abdominal:     Comments: Gravid  Musculoskeletal:         General: Normal range of motion.     Cervical back: Normal range of motion.  Skin:    General: Skin is warm and dry.  Neurological:     Mental Status: She is alert and oriented to person, place, and time.  Psychiatric:        Mood and Affect: Mood normal.        Behavior: Behavior normal.        Thought Content: Thought content normal.     Fetal Assessment 140 bpm, Mod Var, -Decels, +Accels, One 15x15 and Multiple 10x10 Toco: No Ctx Graphed  MAU Course  No results found for this or any previous visit (from the past 24 hour(s)). No results found.  MDM Physical Exam Labs: CBC, CMP, PC Ratio Measure BPQ15 min EFM Antihypertensive Assessment and Plan  39 year old G2P0010  SIUP at 37weeks Cat I FT CHTN DFM Covid Positive  -POC Reviewed -Exam findings discussed. -Will give Labetalol dosing now. -NST Reassuring, but not reactive. -Will reposition and continue to monitor. -Labs drawn and pending. -Will monitor and reassess.   Cherre Robins MSN, CNM 05/23/2020, 7:24 AM   Reassessment (8:10 AM) -Report given to L. Leftwich-Kirby, CNM   Results for orders placed or performed during the hospital encounter of 05/23/20 (from the past 24 hour(s))  Urinalysis, Routine w reflex microscopic Urine, Clean Catch     Status: Abnormal   Collection Time: 05/23/20  6:53 AM  Result Value Ref Range   Color, Urine YELLOW YELLOW   APPearance CLOUDY (A) CLEAR   Specific Gravity, Urine 1.025 1.005 - 1.030   pH 6.0 5.0 - 8.0   Glucose, UA NEGATIVE NEGATIVE mg/dL   Hgb urine dipstick NEGATIVE NEGATIVE   Bilirubin Urine NEGATIVE NEGATIVE   Ketones, ur NEGATIVE NEGATIVE mg/dL   Protein, ur NEGATIVE NEGATIVE mg/dL   Nitrite NEGATIVE NEGATIVE   Leukocytes,Ua SMALL (A) NEGATIVE  Urinalysis, Microscopic (reflex)     Status: Abnormal   Collection Time: 05/23/20  6:53 AM  Result Value Ref Range   RBC / HPF 0-5 0 - 5 RBC/hpf   WBC, UA 6-10 0 - 5 WBC/hpf   Bacteria, UA MANY (A) NONE  SEEN   Squamous Epithelial / LPF 0-5 0 - 5   Mucus PRESENT   Protein / creatinine ratio, urine     Status: None   Collection Time: 05/23/20  7:10 AM  Result Value Ref Range   Creatinine, Urine 208.74 mg/dL   Total Protein, Urine 32 mg/dL  Protein Creatinine Ratio 0.15 0.00 - 0.15 mg/mg[Cre]  CBC     Status: Abnormal   Collection Time: 05/23/20  7:29 AM  Result Value Ref Range   WBC 5.6 4.0 - 10.5 K/uL   RBC 3.66 (L) 3.87 - 5.11 MIL/uL   Hemoglobin 11.1 (L) 12.0 - 15.0 g/dL   HCT 29.9 (L) 36 - 46 %   MCV 91.0 80.0 - 100.0 fL   MCH 30.3 26.0 - 34.0 pg   MCHC 33.3 30.0 - 36.0 g/dL   RDW 37.1 69.6 - 78.9 %   Platelets 157 150 - 400 K/uL   nRBC 0.0 0.0 - 0.2 %  Comprehensive metabolic panel     Status: Abnormal   Collection Time: 05/23/20  7:29 AM  Result Value Ref Range   Sodium 135 135 - 145 mmol/L   Potassium 3.6 3.5 - 5.1 mmol/L   Chloride 106 98 - 111 mmol/L   CO2 20 (L) 22 - 32 mmol/L   Glucose, Bld 89 70 - 99 mg/dL   BUN 9 6 - 20 mg/dL   Creatinine, Ser 3.81 0.44 - 1.00 mg/dL   Calcium 8.0 (L) 8.9 - 10.3 mg/dL   Total Protein 5.0 (L) 6.5 - 8.1 g/dL   Albumin 2.3 (L) 3.5 - 5.0 g/dL   AST 26 15 - 41 U/L   ALT 27 0 - 44 U/L   Alkaline Phosphatase 90 38 - 126 U/L   Total Bilirubin 0.3 0.3 - 1.2 mg/dL   GFR calc non Af Amer >60 >60 mL/min   GFR calc Af Amer >60 >60 mL/min   Anion gap 9 5 - 15   MDM:  Pt BP wnl after PO labetalol in MAU, no severe range BP during MAU stay No severe features or s/sx of PEC NST reactive and pt feeling normal fetal movement PEC labs wnl with P/C ratio of 0.15 Dr Rana Snare called MAU and reported plan to move pt IOL to earlier than 39 weeks for [facility] with worsening BP control Discussed monoclonal antibodies for COVID positive in pregnancy and pt is interested. Left message for pt OB office to f/u with patient about new IOL date and about scheduling outpatient MAB at IV infusion center  D/C home with [facility] precautions/reasons to return to MAU Pt  to take BP at home one time per day and f/u with OB provider if severe range or if she has s/sx of PEC  Sharen Counter, CNM 6:56 PM

## 2020-05-24 ENCOUNTER — Telehealth (HOSPITAL_COMMUNITY): Payer: Self-pay | Admitting: *Deleted

## 2020-05-24 ENCOUNTER — Encounter (HOSPITAL_COMMUNITY): Payer: Self-pay | Admitting: *Deleted

## 2020-05-24 NOTE — Telephone Encounter (Signed)
Preadmission screen  

## 2020-05-26 ENCOUNTER — Other Ambulatory Visit: Payer: Self-pay | Admitting: Physician Assistant

## 2020-05-26 DIAGNOSIS — O10919 Unspecified pre-existing hypertension complicating pregnancy, unspecified trimester: Secondary | ICD-10-CM

## 2020-05-26 DIAGNOSIS — U071 COVID-19: Secondary | ICD-10-CM

## 2020-05-26 DIAGNOSIS — I10 Essential (primary) hypertension: Secondary | ICD-10-CM

## 2020-05-26 NOTE — Progress Notes (Signed)
I connected by phone with Rebecca Orozco on 05/26/2020 at 12:33 PM to discuss the potential use of a new treatment for mild to moderate COVID-19 viral infection in non-hospitalized patients.  This patient is a 39 y.o. female that meets the FDA criteria for Emergency Use Authorization of COVID monoclonal antibody casirivimab/imdevimab.  Has a (+) direct SARS-CoV-2 viral test result  Has mild or moderate COVID-19   Is NOT hospitalized due to COVID-19  Is within 10 days of symptom onset  Has at least one of the high risk factor(s) for progression to severe COVID-19 and/or hospitalization as defined in EUA.  Specific high risk criteria : BMI > 25, Pregnancy and Cardiovascular disease or hypertension   I have spoken and communicated the following to the patient or parent/caregiver regarding COVID monoclonal antibody treatment:  1. FDA has authorized the emergency use for the treatment of mild to moderate COVID-19 in adults and pediatric patients with positive results of direct SARS-CoV-2 viral testing who are 75 years of age and older weighing at least 40 kg, and who are at high risk for progressing to severe COVID-19 and/or hospitalization.  2. The significant known and potential risks and benefits of COVID monoclonal antibody, and the extent to which such potential risks and benefits are unknown.  3. Information on available alternative treatments and the risks and benefits of those alternatives, including clinical trials.  4. Patients treated with COVID monoclonal antibody should continue to self-isolate and use infection control measures (e.g., wear mask, isolate, social distance, avoid sharing personal items, clean and disinfect "high touch" surfaces, and frequent handwashing) according to CDC guidelines.   5. The patient or parent/caregiver has the option to accept or refuse COVID monoclonal antibody treatment.  After reviewing this information with the patient, The patient agreed to  proceed with receiving casirivimab\imdevimab infusion and will be provided a copy of the Fact sheet prior to receiving the infusion.  Sx onset 9/9. Set up for infusion on 9/16 @ 9:30am. Directions given to [facility]. Pt is aware that insurance will be charged an infusion fee. Pt in unvaccinated. Pt [redacted] weeks pregnant and patient reported Dr. Langston Masker, her Ob, approving this infusion. Will forward her this note.   Cline Crock 05/26/2020 12:33 PM

## 2020-05-27 ENCOUNTER — Ambulatory Visit (HOSPITAL_COMMUNITY)
Admission: RE | Admit: 2020-05-27 | Discharge: 2020-05-27 | Disposition: A | Discharge status: Home / Self Care | Payer: BC Managed Care – PPO | Source: Ambulatory Visit | Attending: Pulmonary Disease | Admitting: Pulmonary Disease

## 2020-05-27 DIAGNOSIS — U071 COVID-19: Secondary | ICD-10-CM

## 2020-05-27 DIAGNOSIS — O10919 Unspecified pre-existing hypertension complicating pregnancy, unspecified trimester: Secondary | ICD-10-CM | POA: Insufficient documentation

## 2020-05-27 DIAGNOSIS — I10 Essential (primary) hypertension: Secondary | ICD-10-CM

## 2020-05-27 MED ORDER — FAMOTIDINE IN NACL 20-0.9 MG/50ML-% IV SOLN: 20.0000 mg | Freq: Once | INTRAVENOUS | Status: DC | PRN

## 2020-05-27 MED ORDER — DIPHENHYDRAMINE HCL 50 MG/ML IJ SOLN: 50.0000 mg | Freq: Once | INTRAMUSCULAR | Status: DC | PRN

## 2020-05-27 MED ORDER — ALBUTEROL SULFATE HFA 108 (90 BASE) MCG/ACT IN AERS: 2.0000 | INHALATION_SPRAY | Freq: Once | RESPIRATORY_TRACT | Status: DC | PRN

## 2020-05-27 MED ORDER — SODIUM CHLORIDE 0.9 % IV SOLN: INTRAVENOUS | Status: DC | PRN

## 2020-05-27 MED ORDER — EPINEPHRINE 0.3 MG/0.3ML IJ SOAJ: 0.3000 mg | Freq: Once | INTRAMUSCULAR | Status: DC | PRN

## 2020-05-27 MED ORDER — SODIUM CHLORIDE 0.9 % IV SOLN
1200.0000 mg | Freq: Once | INTRAVENOUS | Status: AC
  Administered 2020-05-27: 1200 mg via INTRAVENOUS

## 2020-05-27 MED ORDER — METHYLPREDNISOLONE SODIUM SUCC 125 MG IJ SOLR: 125.0000 mg | Freq: Once | INTRAMUSCULAR | Status: DC | PRN

## 2020-05-27 NOTE — Discharge Instructions (Signed)

## 2020-05-28 ENCOUNTER — Other Ambulatory Visit: Payer: Self-pay

## 2020-05-28 ENCOUNTER — Inpatient Hospital Stay (HOSPITAL_COMMUNITY): Payer: BC Managed Care – PPO | Admitting: Anesthesiology

## 2020-05-28 ENCOUNTER — Inpatient Hospital Stay (HOSPITAL_COMMUNITY): Payer: BC Managed Care – PPO

## 2020-05-28 ENCOUNTER — Inpatient Hospital Stay (HOSPITAL_COMMUNITY)
Admission: AD | Admit: 2020-05-28 | Discharge: 2020-05-31 | DRG: 798 | Disposition: A | Discharge status: Home / Self Care | Payer: BC Managed Care – PPO | Attending: Obstetrics & Gynecology | Admitting: Obstetrics & Gynecology

## 2020-05-28 ENCOUNTER — Encounter (HOSPITAL_COMMUNITY): Payer: Self-pay | Admitting: Obstetrics & Gynecology

## 2020-05-28 DIAGNOSIS — O1002 Pre-existing essential hypertension complicating childbirth: Secondary | ICD-10-CM | POA: Diagnosis present

## 2020-05-28 DIAGNOSIS — O114 Pre-existing hypertension with pre-eclampsia, complicating childbirth: Principal | ICD-10-CM | POA: Diagnosis present

## 2020-05-28 DIAGNOSIS — O10919 Unspecified pre-existing hypertension complicating pregnancy, unspecified trimester: Secondary | ICD-10-CM | POA: Diagnosis present

## 2020-05-28 DIAGNOSIS — O119 Pre-existing hypertension with pre-eclampsia, unspecified trimester: Secondary | ICD-10-CM | POA: Diagnosis present

## 2020-05-28 DIAGNOSIS — Z3A37 37 weeks gestation of pregnancy: Secondary | ICD-10-CM

## 2020-05-28 LAB — COMPREHENSIVE METABOLIC PANEL
ALT: 26 U/L (ref 0–44)
AST: 28 U/L (ref 15–41)
Albumin: 2.2 g/dL — ABNORMAL LOW (ref 3.5–5.0)
Alkaline Phosphatase: 106 U/L (ref 38–126)
Anion gap: 9 (ref 5–15)
BUN: 7 mg/dL (ref 6–20)
CO2: 18 mmol/L — ABNORMAL LOW (ref 22–32)
Calcium: 8 mg/dL — ABNORMAL LOW (ref 8.9–10.3)
Chloride: 108 mmol/L (ref 98–111)
Creatinine, Ser: 0.78 mg/dL (ref 0.44–1.00)
GFR calc Af Amer: >60 mL/min (ref >60)
GFR calc non Af Amer: >60 mL/min (ref >60)
Glucose, Bld: 89 mg/dL (ref 70–99)
Potassium: 3.5 mmol/L (ref 3.5–5.1)
Sodium: 135 mmol/L (ref 135–145)
Total Bilirubin: 0.4 mg/dL (ref 0.3–1.2)
Total Protein: 4.9 g/dL — ABNORMAL LOW (ref 6.5–8.1)

## 2020-05-28 LAB — TYPE AND SCREEN
ABO/RH(D): A POS
Antibody Screen: NEGATIVE

## 2020-05-28 LAB — CBC
HCT: 33.8 % — ABNORMAL LOW (ref 36.0–46.0)
HCT: 34.3 % — ABNORMAL LOW (ref 36.0–46.0)
Hemoglobin: 11.1 g/dL — ABNORMAL LOW (ref 12.0–15.0)
Hemoglobin: 11.3 g/dL — ABNORMAL LOW (ref 12.0–15.0)
MCH: 29.4 pg (ref 26.0–34.0)
MCH: 29.3 pg (ref 26.0–34.0)
MCHC: 32.8 g/dL (ref 30.0–36.0)
MCHC: 32.9 g/dL (ref 30.0–36.0)
MCV: 89.7 fL (ref 80.0–100.0)
MCV: 88.9 fL (ref 80.0–100.0)
Platelets: 153 10*3/uL (ref 150–400)
Platelets: 149 10*3/uL — ABNORMAL LOW (ref 150–400)
RBC: 3.77 MIL/uL — ABNORMAL LOW (ref 3.87–5.11)
RBC: 3.86 MIL/uL — ABNORMAL LOW (ref 3.87–5.11)
RDW: 13.7 % (ref 11.5–15.5)
RDW: 13.6 % (ref 11.5–15.5)
WBC: 5.8 10*3/uL (ref 4.0–10.5)
WBC: 4 10*3/uL (ref 4.0–10.5)
nRBC: 0 % (ref 0.0–0.2)
nRBC: 0 % (ref 0.0–0.2)

## 2020-05-28 LAB — RPR: RPR Ser Ql: NONREACTIVE

## 2020-05-28 MED ORDER — PHENYLEPHRINE 40 MCG/ML (10ML) SYRINGE FOR IV PUSH (FOR BLOOD PRESSURE SUPPORT): 80.0000 ug | PREFILLED_SYRINGE | INTRAVENOUS | Status: DC | PRN

## 2020-05-28 MED ORDER — SOD CITRATE-CITRIC ACID 500-334 MG/5ML PO SOLN: 30.0000 mL | ORAL | Status: DC | PRN

## 2020-05-28 MED ORDER — SODIUM CHLORIDE 0.9 % IV SOLN
8.0000 mg | Freq: Once | INTRAVENOUS | Status: AC
  Administered 2020-05-28: 8 mg via INTRAVENOUS
  Filled 2020-05-28: qty 4

## 2020-05-28 MED ORDER — EPHEDRINE 5 MG/ML INJ: 10.0000 mg | INTRAVENOUS | Status: DC | PRN

## 2020-05-28 MED ORDER — ZOLPIDEM TARTRATE 5 MG PO TABS: 5.0000 mg | ORAL_TABLET | Freq: Every evening | ORAL | Status: DC | PRN

## 2020-05-28 MED ORDER — FENTANYL CITRATE (PF) 100 MCG/2ML IJ SOLN
50.0000 ug | INTRAMUSCULAR | Status: DC | PRN
  Administered 2020-05-28: 100 ug via INTRAVENOUS
  Filled 2020-05-28: qty 2

## 2020-05-28 MED ORDER — LABETALOL HCL 200 MG PO TABS
200.0000 mg | ORAL_TABLET | Freq: Two times a day (BID) | ORAL | Status: DC
  Administered 2020-05-28 – 2020-05-31 (×7): 200 mg via ORAL
  Filled 2020-05-28 (×7): qty 1

## 2020-05-28 MED ORDER — DIPHENHYDRAMINE HCL 50 MG/ML IJ SOLN: 12.5000 mg | INTRAMUSCULAR | Status: DC | PRN

## 2020-05-28 MED ORDER — LIDOCAINE HCL (PF) 1 % IJ SOLN
INTRAMUSCULAR | Status: DC | PRN
  Administered 2020-05-28: 11 mL via EPIDURAL

## 2020-05-28 MED ORDER — LACTATED RINGERS IV SOLN: 500.0000 mL | INTRAVENOUS | Status: DC | PRN

## 2020-05-28 MED ORDER — LIDOCAINE HCL (PF) 1 % IJ SOLN: 30.0000 mL | INTRAMUSCULAR | Status: DC | PRN

## 2020-05-28 MED ORDER — MAGNESIUM SULFATE BOLUS VIA INFUSION
4.0000 g | Freq: Once | INTRAVENOUS | Status: AC
  Administered 2020-05-28: 4 g via INTRAVENOUS
  Filled 2020-05-28: qty 1000

## 2020-05-28 MED ORDER — OXYCODONE-ACETAMINOPHEN 5-325 MG PO TABS: 1.0000 | ORAL_TABLET | ORAL | Status: DC | PRN

## 2020-05-28 MED ORDER — BUPIVACAINE HCL (PF) 0.75 % IJ SOLN
INTRAMUSCULAR | Status: DC | PRN
Start: 2020-05-28 — End: 2020-05-29
  Administered 2020-05-28: 12 mL/h via EPIDURAL

## 2020-05-28 MED ORDER — LABETALOL HCL 5 MG/ML IV SOLN
20.0000 mg | Freq: Once | INTRAVENOUS | Status: AC
  Administered 2020-05-28: 20 mg via INTRAVENOUS

## 2020-05-28 MED ORDER — TERBUTALINE SULFATE 1 MG/ML IJ SOLN: 0.2500 mg | Freq: Once | INTRAMUSCULAR | Status: DC | PRN

## 2020-05-28 MED ORDER — HYDRALAZINE HCL 20 MG/ML IJ SOLN: 10.0000 mg | INTRAMUSCULAR | Status: DC | PRN

## 2020-05-28 MED ORDER — OXYTOCIN-SODIUM CHLORIDE 30-0.9 UT/500ML-% IV SOLN
1.0000 m[IU]/min | INTRAVENOUS | Status: DC
  Administered 2020-05-28: 2 m[IU]/min via INTRAVENOUS
  Filled 2020-05-28: qty 500

## 2020-05-28 MED ORDER — MISOPROSTOL 25 MCG QUARTER TABLET
25.0000 ug | ORAL_TABLET | ORAL | Status: DC | PRN
  Administered 2020-05-28 (×2): 25 ug via VAGINAL
  Filled 2020-05-28 (×2): qty 1

## 2020-05-28 MED ORDER — LACTATED RINGERS IV SOLN: 500.0000 mL | Freq: Once | INTRAVENOUS | Status: DC

## 2020-05-28 MED ORDER — OXYTOCIN-SODIUM CHLORIDE 30-0.9 UT/500ML-% IV SOLN
2.5000 [IU]/h | INTRAVENOUS | Status: DC
  Filled 2020-05-28: qty 500

## 2020-05-28 MED ORDER — ACETAMINOPHEN 325 MG PO TABS: 650.0000 mg | ORAL_TABLET | ORAL | Status: DC | PRN

## 2020-05-28 MED ORDER — OXYTOCIN BOLUS FROM INFUSION
333.0000 mL | Freq: Once | INTRAVENOUS | Status: AC
  Administered 2020-05-29: 333 mL via INTRAVENOUS

## 2020-05-28 MED ORDER — LABETALOL HCL 5 MG/ML IV SOLN
INTRAVENOUS | Status: AC
  Filled 2020-05-28: qty 4

## 2020-05-28 MED ORDER — LABETALOL HCL 5 MG/ML IV SOLN: 40.0000 mg | INTRAVENOUS | Status: DC | PRN

## 2020-05-28 MED ORDER — LABETALOL HCL 5 MG/ML IV SOLN: 80.0000 mg | INTRAVENOUS | Status: DC | PRN

## 2020-05-28 MED ORDER — LABETALOL HCL 5 MG/ML IV SOLN
40.0000 mg | Freq: Once | INTRAVENOUS | Status: AC
  Administered 2020-05-28: 40 mg via INTRAVENOUS

## 2020-05-28 MED ORDER — LACTATED RINGERS IV SOLN: INTRAVENOUS | Status: DC

## 2020-05-28 MED ORDER — LABETALOL HCL 5 MG/ML IV SOLN
INTRAVENOUS | Status: AC
  Filled 2020-05-28: qty 8

## 2020-05-28 MED ORDER — FENTANYL-BUPIVACAINE-NACL 0.5-0.125-0.9 MG/250ML-% EP SOLN
12.0000 mL/h | EPIDURAL | Status: DC | PRN
  Filled 2020-05-28: qty 250

## 2020-05-28 MED ORDER — MAGNESIUM SULFATE 40 GM/1000ML IV SOLN
2.0000 g/h | INTRAVENOUS | Status: DC
  Filled 2020-05-28: qty 1000

## 2020-05-28 MED ORDER — LABETALOL HCL 5 MG/ML IV SOLN: 20.0000 mg | INTRAVENOUS | Status: DC | PRN

## 2020-05-28 MED ORDER — OXYCODONE-ACETAMINOPHEN 5-325 MG PO TABS: 2.0000 | ORAL_TABLET | ORAL | Status: DC | PRN

## 2020-05-28 MED ORDER — ONDANSETRON HCL 4 MG/2ML IJ SOLN
4.0000 mg | Freq: Four times a day (QID) | INTRAMUSCULAR | Status: DC | PRN
  Administered 2020-05-28 – 2020-05-29 (×3): 4 mg via INTRAVENOUS
  Filled 2020-05-28 (×4): qty 2

## 2020-05-28 NOTE — Progress Notes (Signed)
Spoke to Dr. Langston Masker to report blood pressures. Patient is stating pain level of 7 requesting an epidural.  Will continue to assess blood pressure.

## 2020-05-28 NOTE — H&P (Signed)
[place] Riles is a 39 y.o. female G2P0010 at [redacted]w[redacted]d presenting for IOL secondary to chronic hypertension; labetalol 200bid and low dose ASA.  Patient was previously well controlled but on 9/7 required admission for exacerbation of CHTN and received BMZ.  She was discharged with stable BPs and no changes to medication.  Patient denies HA, vision change, RUQ pain, CP/SOB. This pregnancy is product of IVF with PGS (surprise gender).  Fetal ECHO wnl.  Patient has h/o anxiety and depression which has been stable this pregnancy on no medication.  GBS negative.  Feeling mild cramps after second dose of VMP at 0446.    OB History    Gravida  2   Para      Term      Preterm      AB  1   Living        SAB  1   TAB      Ectopic      Multiple      Live Births             Past Medical History:  Diagnosis Date  . Anxiety and depression   . Cervical dysplasia   . Flu    flu meningitis as infant  . History of meningitis   . HTN (hypertension)   . Migraines   . Vaginal Pap smear, abnormal    Past Surgical History:  Procedure Laterality Date  . TONSILLECTOMY    . VARICOSE VEIN SURGERY Right    Family History: family history includes Colon cancer (age of onset: 41) in her mother; Hypertension in her mother. Social History:  reports that she has never smoked. She has never used smokeless tobacco. She reports previous alcohol use. She reports that she does not use drugs.     Maternal Diabetes: No Genetic Screening: Normal Maternal Ultrasounds/Referrals: Normal Fetal Ultrasounds or other Referrals:  None Maternal Substance Abuse:  No Significant Maternal Medications:  Meds include: Other:  labetalol Significant Maternal Lab Results:  Group B Strep negative Other Comments:  None  Review of Systems Maternal Medical History:  Contractions: Frequency: rare.   Perceived severity is mild.    Fetal activity: Perceived fetal activity is normal.   Last perceived fetal movement was  within the past hour.    Prenatal complications: PIH.   Prenatal Complications - Diabetes: none.    Dilation: 1 Effacement (%): Thick Station: -3 Exam by:: Dr. Langston Masker  Foley bulb placed   Blood pressure (!) 153/107, pulse (!) 59, temperature 98.2 F (36.8 C), temperature source Oral, resp. rate 17, height 5\' 6"  (1.676 m), weight 117 kg, last menstrual period 09/07/2019, unknown if currently breastfeeding. Maternal Exam:  Uterine Assessment: Contraction strength is mild.  Contraction frequency is rare.   Abdomen: Patient reports no abdominal tenderness. Fundal height is c/w dates.   Estimated fetal weight is 7#.   Fetal presentation: vertex  Introitus: Normal vulva. Pelvis: adequate for delivery.   Cervix: Cervix evaluated by digital exam.     Fetal Exam Fetal Monitor Review: Baseline rate: 130.  Variability: moderate (6-25 bpm).   Pattern: accelerations present and no decelerations.    Fetal State Assessment: Category I - tracings are normal.     Physical Exam Constitutional:      Appearance: Normal appearance.  HENT:     Head: Normocephalic and atraumatic.  Pulmonary:     Effort: Pulmonary effort is normal.  Abdominal:     Palpations: Abdomen is soft.  Genitourinary:  General: Normal vulva.  Musculoskeletal:     Cervical back: Normal range of motion and neck supple.  Skin:    General: Skin is warm and dry.  Neurological:     Mental Status: She is alert.  Psychiatric:        Mood and Affect: Mood normal.        Behavior: Behavior normal.     Prenatal labs: ABO, Rh: --/--/A POS (09/17 0020) Antibody: NEG (09/17 0020) Rubella: Immune (03/24 0000) RPR: Nonreactive (03/24 0000)  HBsAg: Negative (03/24 0000)  HIV: Non-reactive (07/01 0000)  GBS: Negative/-- (09/07 0000)   Assessment/Plan: 39yo G2P0010 at [redacted]w[redacted]d with CHTN for IOL -Foley bulb placed and will start pitocin -CLEA if desired -CHTN-monitor for symptoms and start magnesium sulfate prn  severe range BPs; tx with IV protocol -Anticipate NSVD  Mitchel Honour 05/28/2020, 9:13 AM

## 2020-05-28 NOTE — Progress Notes (Signed)
Inability to monitor CTX with external monitor SVE 6/50/-2, IUPC placed  Mitchel Honour, DO

## 2020-05-28 NOTE — Progress Notes (Signed)
[place] Duffey is a 39 y.o. G2P0010 at [redacted]w[redacted]d by ultrasound admitted for induction of labor due to Hypertension.  Subjective: Comfortable with CLEA.  No HA, vision change, RUQ pain, CP/SOB.  Objective: BP (!) 158/100   Pulse 65   Temp 98.2 F (36.8 C) (Oral)   Resp 16   Ht 5\' 6"  (1.676 m)   Wt 117 kg   LMP 09/07/2019   BMI 41.64 kg/m  No intake/output data recorded. No intake/output data recorded.  FHT:  FHR: 120 bpm, variability: moderate,  accelerations:  Abscent,  decelerations:  Absent UC:   regular, every 2 minutes SVE:   Dilation: 5 Effacement (%): 50 Station: -2 Exam by:: Dr. [email or web address]  AROM, clear  Labs: Lab Results  Component Value Date   WBC 4.0 05/28/2020   HGB 11.3 (L) 05/28/2020   HCT 34.3 (L) 05/28/2020   MCV 88.9 05/28/2020   PLT 149 (L) 05/28/2020    Assessment / Plan: Induction of labor due to [facility],  progressing well on pitocin  CHTN-severe range BPs improved after foley bulb out; this relieved significant pain for patient.  BPs were treated during that time with one dose 20 IV labetalol.  Will continue to monitor.  Magnesium sulfate for persistent severe range BPs moving forward.  Labor: Progressing normally Preeclampsia:  n/a Fetal Wellbeing:  Category I Pain Control:  Epidural I/D:  n/a Anticipated MOD:  NSVD  [facility] 05/28/2020, 1:45 PM

## 2020-05-28 NOTE — Anesthesia Preprocedure Evaluation (Signed)
Anesthesia Evaluation  Patient identified by MRN, date of birth, ID band Patient awake    Reviewed: Allergy & Precautions, NPO status , Patient's Chart, lab work & pertinent test results  Airway Mallampati: II  TM Distance: >3 FB Neck ROM: Full    Dental no notable dental hx.    Pulmonary neg pulmonary ROS,    Pulmonary exam normal breath sounds clear to auscultation       Cardiovascular hypertension, negative cardio ROS Normal cardiovascular exam Rhythm:Regular Rate:Normal     Neuro/Psych  Headaches, Anxiety Depression negative psych ROS   GI/Hepatic negative GI ROS, Neg liver ROS,   Endo/Other  negative endocrine ROS  Renal/GU negative Renal ROS  negative genitourinary   Musculoskeletal negative musculoskeletal ROS (+)   Abdominal (+) + obese,   Peds negative pediatric ROS (+)  Hematology negative hematology ROS (+)   Anesthesia Other Findings   Reproductive/Obstetrics (+) Pregnancy                             Anesthesia Physical Anesthesia Plan  ASA: II  Anesthesia Plan: Epidural   Post-op Pain Management:    Induction:   PONV Risk Score and Plan:   Airway Management Planned:   Additional Equipment:   Intra-op Plan:   Post-operative Plan:   Informed Consent:   Plan Discussed with:   Anesthesia Plan Comments:         Anesthesia Quick Evaluation

## 2020-05-28 NOTE — Progress Notes (Signed)
Rebecca Orozco is a 39 y.o. G2P0010 at 100w5d by ultrasound admitted for induction of labor due to Hypertension.  Subjective: Comfortable with CLEA.  No HA, vision change, RUQ pain, CP/SOB.  Objective: BP (!) 152/98    Pulse 80    Temp 98 F (36.7 C)    Resp 16    Ht 5\' 6"  (1.676 m)    Wt 117 kg    LMP 09/07/2019    BMI 41.64 kg/m  No intake/output data recorded. No intake/output data recorded.  FHT:  FHR: 120 bpm, variability: moderate,  accelerations:  Present,  decelerations:  Present early UC:   Every 2-3 minutes SVE:   Dilation: 6 Effacement (%): 70 Station: -2 Exam by:: [email or web address], RN MVUs inadequate on pitocin 24  Labs: Lab Results  Component Value Date   WBC 4.0 05/28/2020   HGB 11.3 (L) 05/28/2020   HCT 34.3 (L) 05/28/2020   MCV 88.9 05/28/2020   PLT 149 (L) 05/28/2020    Assessment / Plan: Protracted active phase  CHTN: persistent severe range BPs; received IV labetalol 40 mg and Magnesium sulfate 6/2 started Labor: Minimal cervical change since foley bulb out; will continue to monitor MVUs and increase pitocin appropriately Preeclampsia:  on magnesium sulfate and no signs or symptoms of toxicity Fetal Wellbeing:  Category I Pain Control:  Epidural I/D:  n/a Anticipated MOD:  NSVD  8/2 05/28/2020, 8:17 PM

## 2020-05-28 NOTE — Anesthesia Procedure Notes (Addendum)
Epidural Patient location during procedure: OB Start time: 05/28/2020 11:10 AM End time: 05/28/2020 11:19 AM  Staffing Anesthesiologist: Lowella Curb, MD Performed: anesthesiologist   Preanesthetic Checklist Completed: patient identified, IV checked, site marked, risks and benefits discussed, surgical consent, monitors and equipment checked, pre-op evaluation and timeout performed  Epidural Patient position: sitting Prep: ChloraPrep Patient monitoring: heart rate, cardiac monitor, continuous pulse ox and blood pressure Approach: midline Location: L2-L3 Injection technique: LOR saline  Needle:  Needle type: Tuohy  Needle gauge: 17 G Needle length: 9 cm Needle insertion depth: 6 cm Catheter type: closed end flexible Catheter size: 20 Guage Catheter at skin depth: 11 cm Test dose: negative  Assessment Events: blood not aspirated, injection not painful, no injection resistance, no paresthesia and negative IV test  Additional Notes Epidural placed by SRNA under direct supervisionReason for block:procedure for pain

## 2020-05-29 ENCOUNTER — Encounter (HOSPITAL_COMMUNITY): Payer: Self-pay | Admitting: Obstetrics & Gynecology

## 2020-05-29 DIAGNOSIS — O119 Pre-existing hypertension with pre-eclampsia, unspecified trimester: Secondary | ICD-10-CM | POA: Diagnosis present

## 2020-05-29 HISTORY — DX: Pre-existing hypertension with pre-eclampsia, unspecified trimester: O11.9

## 2020-05-29 LAB — COMPREHENSIVE METABOLIC PANEL
ALT: 25 U/L (ref 0–44)
AST: 36 U/L (ref 15–41)
Albumin: 2 g/dL — ABNORMAL LOW (ref 3.5–5.0)
Alkaline Phosphatase: 106 U/L (ref 38–126)
Anion gap: 9 (ref 5–15)
BUN: 6 mg/dL (ref 6–20)
CO2: 18 mmol/L — ABNORMAL LOW (ref 22–32)
Calcium: 6.6 mg/dL — ABNORMAL LOW (ref 8.9–10.3)
Chloride: 104 mmol/L (ref 98–111)
Creatinine, Ser: 0.91 mg/dL (ref 0.44–1.00)
GFR calc Af Amer: >60 mL/min (ref >60)
GFR calc non Af Amer: >60 mL/min (ref >60)
Glucose, Bld: 155 mg/dL — ABNORMAL HIGH (ref 70–99)
Potassium: 3.8 mmol/L (ref 3.5–5.1)
Sodium: 131 mmol/L — ABNORMAL LOW (ref 135–145)
Total Bilirubin: 0.6 mg/dL (ref 0.3–1.2)
Total Protein: 4.9 g/dL — ABNORMAL LOW (ref 6.5–8.1)

## 2020-05-29 LAB — CBC
HCT: 32.2 % — ABNORMAL LOW (ref 36.0–46.0)
Hemoglobin: 10.3 g/dL — ABNORMAL LOW (ref 12.0–15.0)
MCH: 28.9 pg (ref 26.0–34.0)
MCHC: 32 g/dL (ref 30.0–36.0)
MCV: 90.4 fL (ref 80.0–100.0)
Platelets: 148 10*3/uL — ABNORMAL LOW (ref 150–400)
RBC: 3.56 MIL/uL — ABNORMAL LOW (ref 3.87–5.11)
RDW: 13.5 % (ref 11.5–15.5)
WBC: 13 10*3/uL — ABNORMAL HIGH (ref 4.0–10.5)
nRBC: 0 % (ref 0.0–0.2)

## 2020-05-29 MED ORDER — SIMETHICONE 80 MG PO CHEW: 80.0000 mg | CHEWABLE_TABLET | ORAL | Status: DC | PRN

## 2020-05-29 MED ORDER — MISOPROSTOL 200 MCG PO TABS
1000.0000 ug | ORAL_TABLET | Freq: Once | ORAL | Status: AC
  Administered 2020-05-29: 1000 ug via RECTAL

## 2020-05-29 MED ORDER — DIPHENHYDRAMINE HCL 25 MG PO CAPS: 25.0000 mg | ORAL_CAPSULE | Freq: Four times a day (QID) | ORAL | Status: DC | PRN

## 2020-05-29 MED ORDER — MAGNESIUM SULFATE 40 GM/1000ML IV SOLN
2.0000 g/h | INTRAVENOUS | Status: AC
  Administered 2020-05-29: 2 g/h via INTRAVENOUS
  Filled 2020-05-29: qty 1000

## 2020-05-29 MED ORDER — ONDANSETRON HCL 4 MG PO TABS
4.0000 mg | ORAL_TABLET | ORAL | Status: DC | PRN
  Administered 2020-05-29 – 2020-05-30 (×3): 4 mg via ORAL
  Filled 2020-05-29 (×3): qty 1

## 2020-05-29 MED ORDER — ACETAMINOPHEN 325 MG PO TABS: 650.0000 mg | ORAL_TABLET | ORAL | Status: DC | PRN

## 2020-05-29 MED ORDER — PRENATAL MULTIVITAMIN CH
1.0000 | ORAL_TABLET | Freq: Every day | ORAL | Status: DC
  Administered 2020-05-29 – 2020-05-31 (×3): 1 via ORAL
  Filled 2020-05-29 (×4): qty 1

## 2020-05-29 MED ORDER — FLUTICASONE PROPIONATE 50 MCG/ACT NA SUSP
1.0000 | Freq: Every day | NASAL | Status: DC | PRN
  Filled 2020-05-29: qty 16

## 2020-05-29 MED ORDER — ONDANSETRON HCL 4 MG/2ML IJ SOLN
4.0000 mg | INTRAMUSCULAR | Status: DC | PRN
  Administered 2020-05-29 – 2020-05-30 (×2): 4 mg via INTRAVENOUS
  Filled 2020-05-29 (×2): qty 2

## 2020-05-29 MED ORDER — SENNOSIDES-DOCUSATE SODIUM 8.6-50 MG PO TABS
2.0000 | ORAL_TABLET | ORAL | Status: DC
  Administered 2020-05-30 (×2): 2 via ORAL
  Filled 2020-05-29 (×2): qty 2

## 2020-05-29 MED ORDER — DIPHENOXYLATE-ATROPINE 2.5-0.025 MG PO TABS: 2.0000 | ORAL_TABLET | Freq: Once | ORAL | Status: DC

## 2020-05-29 MED ORDER — IBUPROFEN 600 MG PO TABS
600.0000 mg | ORAL_TABLET | Freq: Four times a day (QID) | ORAL | Status: DC
  Administered 2020-05-29 – 2020-05-31 (×10): 600 mg via ORAL
  Filled 2020-05-29 (×11): qty 1

## 2020-05-29 MED ORDER — WITCH HAZEL-GLYCERIN EX PADS
1.0000 "application " | MEDICATED_PAD | CUTANEOUS | Status: DC | PRN
  Administered 2020-05-29: 1 via TOPICAL

## 2020-05-29 MED ORDER — COCONUT OIL OIL: 1.0000 "application " | TOPICAL_OIL | Status: DC | PRN

## 2020-05-29 MED ORDER — TRANEXAMIC ACID-NACL 1000-0.7 MG/100ML-% IV SOLN
INTRAVENOUS | Status: AC
  Filled 2020-05-29: qty 100

## 2020-05-29 MED ORDER — ZOLPIDEM TARTRATE 5 MG PO TABS
5.0000 mg | ORAL_TABLET | Freq: Every evening | ORAL | Status: DC | PRN
  Administered 2020-05-30: 5 mg via ORAL
  Filled 2020-05-29: qty 1

## 2020-05-29 MED ORDER — CARBOPROST TROMETHAMINE 250 MCG/ML IM SOLN
250.0000 ug | Freq: Once | INTRAMUSCULAR | Status: AC
  Administered 2020-05-29: 250 ug via INTRAMUSCULAR

## 2020-05-29 MED ORDER — CARBOPROST TROMETHAMINE 250 MCG/ML IM SOLN
INTRAMUSCULAR | Status: AC
  Filled 2020-05-29: qty 1

## 2020-05-29 MED ORDER — OXYCODONE-ACETAMINOPHEN 5-325 MG PO TABS
1.0000 | ORAL_TABLET | ORAL | Status: DC | PRN
  Administered 2020-05-29: 1 via ORAL
  Filled 2020-05-29: qty 1

## 2020-05-29 MED ORDER — LACTATED RINGERS IV SOLN: INTRAVENOUS | Status: DC

## 2020-05-29 MED ORDER — TETANUS-DIPHTH-ACELL PERTUSSIS 5-2.5-18.5 LF-MCG/0.5 IM SUSP: 0.5000 mL | Freq: Once | INTRAMUSCULAR | Status: DC

## 2020-05-29 MED ORDER — MISOPROSTOL 200 MCG PO TABS
ORAL_TABLET | ORAL | Status: AC
  Filled 2020-05-29: qty 5

## 2020-05-29 MED ORDER — DIBUCAINE (PERIANAL) 1 % EX OINT: 1.0000 "application " | TOPICAL_OINTMENT | CUTANEOUS | Status: DC | PRN

## 2020-05-29 MED ORDER — BENZOCAINE-MENTHOL 20-0.5 % EX AERO
1.0000 "application " | INHALATION_SPRAY | CUTANEOUS | Status: DC | PRN
  Administered 2020-05-29: 1 via TOPICAL
  Filled 2020-05-29: qty 56

## 2020-05-29 MED ORDER — OXYCODONE-ACETAMINOPHEN 5-325 MG PO TABS: 2.0000 | ORAL_TABLET | ORAL | Status: DC | PRN

## 2020-05-29 MED ORDER — TRANEXAMIC ACID-NACL 1000-0.7 MG/100ML-% IV SOLN
1000.0000 mg | INTRAVENOUS | Status: AC
  Administered 2020-05-29: 1000 mg via INTRAVENOUS

## 2020-05-29 NOTE — Lactation Note (Addendum)
This note was copied from a baby's chart. Lactation Consultation Note  Patient Name: Rebecca Orozco Today's Date: 05/29/2020 Reason for consult: Follow-up assessment;Primapara;1st time breastfeeding;Infant < 6lbs;Early term 37-38.6wks  LC in to visit with P1 Mom of ET infant at 109 hrs old.  Baby's birth weight is 5 lbs 4.8 oz and Mom is on MgSO4 for GHTN.  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  Mom is 40 yrs old.   Baby breastfed 4 times after birth, and then went to nursery under warmer for 97.3 ax temp. Her 2 CBGs were good at 79 and 64.    Baby back with parents.  Baby swaddled in crib sleeping.  Offered to assist with positioning and latching to breast.  Baby unwrapped and she started cueing right away.  Mom has large, heavy breasts with an erect nipple.  Baby positioned STS on right breast in football hold.  Mom assisted in using good head support and support of her breast and baby latched on well.  Showed FOB how to assess baby's latch depth and flanged lips and how to tug on chin to open mouth a bit wider.  Baby stayed on breast and sucked with deep jaw extensions, with occasional swallows.  Showed Mom how to compress breast during sucking to increase milk transfer.    Set up DEBP with instructions on pumping both breasts after baby breastfeeds to support a full milk supply.  FOB shown how to disassemble pump parts, wash, rinse and air dry parts in separate bin provided.  Assisted Mom to pump for first time.  Baby started cueing when diaper changed.  Baby went back on 2nd breast with a deep latch, identifying regular swallows.    Mom has a Spectra DEBP at home.  Mom understands to importance of extra pumping due to baby being small and ET and her history of infertility.   Plan- 1- Keep baby STS as much as possible 2- offer breast with cues, goal of >8 feedings per 24 hrs. 3- Pump both breasts 15 mins, adding hand expression and breast massage to collect colostrum to feed  baby. 4- If supplementation is indicated (weight loss, sleepiness etc), Mom aware of donor breast milk available.  Mom to ask for help if baby becomes too sleepy to latch to breast after 3-4 hrs.    Lactation brochure given to Mom.  Mom aware of IP and OP lactation support available to her.      Feeding Feeding Type: Breast Fed  LATCH Score Latch: Grasps breast easily, tongue down, lips flanged, rhythmical sucking.  Audible Swallowing: Spontaneous and intermittent  Type of Nipple: Everted at rest and after stimulation  Comfort (Breast/Nipple): Soft / non-tender  Hold (Positioning): Assistance needed to correctly position infant at breast and maintain latch.  LATCH Score: 9  Interventions Interventions: Breast feeding basics reviewed;Assisted with latch;Skin to skin;Breast massage;Hand express;DEBP;Position options;Support pillows;Adjust position;Breast compression  Lactation Tools Discussed/Used Tools: Pump;Flanges Flange Size: 24 Breast pump type: Double-Electric Breast Pump WIC Program: No Pump Review: Setup, frequency, and cleaning;Milk Storage Initiated by:: Erby Pian RN IBCLC Date initiated:: 05/29/20   Consult Status Consult Status: Follow-up Date: 05/30/20 Follow-up type: In-patient    Rebecca Orozco 05/29/2020, 11:19 AM

## 2020-05-29 NOTE — Anesthesia Postprocedure Evaluation (Signed)
Anesthesia Post Note  Patient: Rebecca Orozco  Procedure(s) Performed: AN AD HOC LABOR EPIDURAL     Patient location during evaluation: Mother Baby Anesthesia Type: Epidural Level of consciousness: awake and alert Pain management: pain level controlled Vital Signs Assessment: post-procedure vital signs reviewed and stable Respiratory status: spontaneous breathing, nonlabored ventilation and respiratory function stable Cardiovascular status: stable Postop Assessment: no headache, no backache and epidural receding Anesthetic complications: no   No complications documented.  Last Vitals:  Vitals:   05/29/20 0455 05/29/20 0802  BP: (!) 156/96 (!) 147/99  Pulse: 84 85  Resp: 18 18  Temp: 36.9 C 36.7 C  SpO2: 98% 98%    Last Pain:  Vitals:   05/29/20 0802  TempSrc: Oral  PainSc:    Pain Goal:                Epidural/Spinal Function Cutaneous sensation: Normal sensation (05/29/20 0800), Patient able to flex knees: Yes (05/29/20 0800), Patient able to lift hips off bed: Yes (05/29/20 0800), Back pain beyond tenderness at insertion site: No (05/29/20 0800), Progressively worsening motor and/or sensory loss: No (05/29/20 0800), Bowel and/or bladder incontinence post epidural: No (05/29/20 0800)  [name] N

## 2020-05-29 NOTE — Progress Notes (Signed)
Post Partum Day 0 Subjective: no complaints and tolerating PO.  No HA, CP/SOB, RUQ pain, or visual disturbance.  Objective: Blood pressure (!) 147/99, pulse 85, temperature 98.1 F (36.7 C), temperature source Oral, resp. rate 18, height 5\' 6"  (1.676 m), weight 117 kg, last menstrual period 09/07/2019, SpO2 98 %, unknown if currently breastfeeding.  Physical Exam:  General: alert, cooperative and appears stated age Lochia: appropriate Uterine Fundus: firm Incision: healing well, no significant drainage DVT Evaluation: No evidence of DVT seen on physical exam. Negative Homan's sign. No cords or calf tenderness.  Recent Labs    05/28/20 1019 05/29/20 0511  HGB 11.3* 10.3*  HCT 34.3* 32.2*    Assessment/Plan: Plan for discharge tomorrow and Breastfeeding  Super-imposed pre-eclampsia-Continue full 24 hours postpartum magnesium sulfate and monitor for symptoms.  Also, continue labetalol 200 BID.  Labs are wnl.  Once magnesium is up, will closely monitor BPs and add Procardia prn.     LOS: 1 day   05/31/20 05/29/2020, 10:43 AM

## 2020-05-30 MED ORDER — NIFEDIPINE ER OSMOTIC RELEASE 30 MG PO TB24
60.0000 mg | ORAL_TABLET | Freq: Two times a day (BID) | ORAL | Status: DC
  Administered 2020-05-30 – 2020-05-31 (×2): 60 mg via ORAL
  Filled 2020-05-30 (×2): qty 2

## 2020-05-30 MED ORDER — LORAZEPAM 0.5 MG PO TABS
1.0000 mg | ORAL_TABLET | Freq: Once | ORAL | Status: AC
  Administered 2020-05-30: 1 mg via ORAL
  Filled 2020-05-30: qty 2

## 2020-05-30 MED ORDER — FAMOTIDINE 20 MG PO TABS
20.0000 mg | ORAL_TABLET | Freq: Every day | ORAL | Status: DC
  Administered 2020-05-30 – 2020-05-31 (×2): 20 mg via ORAL
  Filled 2020-05-30 (×2): qty 1

## 2020-05-30 MED ORDER — NIFEDIPINE ER OSMOTIC RELEASE 30 MG PO TB24
30.0000 mg | ORAL_TABLET | Freq: Two times a day (BID) | ORAL | Status: DC
  Administered 2020-05-30: 30 mg via ORAL
  Filled 2020-05-30: qty 1

## 2020-05-30 NOTE — Progress Notes (Signed)
Post Partum Day 1 Subjective: no complaints, up ad lib, voiding and tolerating PO.  No HA, vision change, RUQ pain, CP/SOB.  Patient feeling much better since magnesium discontinued early this morning.  Objective: Blood pressure (!) 151/92, pulse 78, temperature 98.1 F (36.7 C), temperature source Oral, resp. rate 18, height 5\' 6"  (1.676 m), weight 117 kg, last menstrual period 09/07/2019, SpO2 98 %, unknown if currently breastfeeding.  Physical Exam:  General: alert, cooperative and appears stated age Lochia: appropriate Uterine Fundus: firm Incision: healing well, no significant drainage, no dehiscence DVT Evaluation: No evidence of DVT seen on physical exam. Negative Homan's sign. No cords or calf tenderness.  Recent Labs    05/28/20 1019 05/29/20 0511  HGB 11.3* 10.3*  HCT 34.3* 32.2*    Assessment/Plan: Plan for discharge tomorrow and Breastfeeding  CHTN with super-imposed pre-eclampsia-S/P magnesium recovery.  Blood pressures not optimized on labetalol 200 BID alone.  Will start procardia 30 XL BID.  Closely monitor BPs and increase dose as needed in anticipation of d/c home tomorrow.     LOS: 2 days   05/31/20 05/30/2020, 9:30 AM

## 2020-05-30 NOTE — Lactation Note (Addendum)
This note was copied from a baby's chart. Lactation Consultation Note  Patient Name: Rebecca Orozco  Infant is 37 weeks 36 hours old bw < 6lbs with 8% weight loss. Mom states breastfeeding as been going well. She has only slight nipple tenderness. Infant at the breast 10-20 minutes per feed. Last night Mom said one session lasted 120 minutes. Explained to Mom infant can cluster feed and keeping infant STS helps. We reviewed the behaviors of LPTI given infants GA and birthweight.   Mom stated last feeding 11:15 am for about 20 minutes. Infant still cueing after diaper change.  Infant had 4 urine and 5 stool. We latched her in football on both breasts and she nursed total of 18 minutes. LC demonstrated to parents signs of milk transfer during the feed. LC demonstrated to Mom how to break the latch to spare her nipples. Mom had compression stripes on her nipples left more than the right. But no nipple trauma or abrasion noted. Mom given   Dad than fed infant DBM using the yellow slow flow nipple in 10 ml increments with burping in between. Infant still cueing on her hand after 20 ml, 6 ml more given. Infant had 1 clear emesis but tolerated feed fine.   Mom started pumping using the 24 flange lined with coconut oil. Mom to pump q 3 hours for 15 minutes. LC reviewed pump assembly, cleaning and milk storage with parents.  RN ordered more donor breast milk for infant.    Plan 1. Mom to feed based on cues 8-12x in 24 hour period, no more than 3 hours without attempt. Mom can apply heat prior to feed to help with milk letdown with massage and hand express.           2. Dad to supplement after nursing using the LPTI guidelines based on hours after birth. Next feeding to supplement 20 ml DBM as tolerated.          3. Mom to pump q 3 hrs for 15 minutes. Snappies given to Mom to collect colostrum with instructions on how spoon feed EBM.

## 2020-05-30 NOTE — Progress Notes (Signed)
MOB was referred for history of depression/anxiety. * Referral screened out by Clinical Social Worker because none of the following criteria appear to apply: ~ History of anxiety/depression during this pregnancy, or of post-partum depression following prior delivery. ~ Diagnosis of anxiety and/or depression within last 3 years OR * MOB's symptoms currently being treated with medication and/or therapy. Per MOB's OB records MOB attends outpatient counseling every 3 months; no concerns noted in MOB's OB records.   Please contact the Clinical Social Worker if needs arise, by [facility] request, or if MOB scores greater than 9/yes to question 10 on Edinburgh Postpartum Depression Screen.  Blaine Hamper, MSW, LCSW Clinical Social Work [phone]

## 2020-05-31 MED ORDER — NIFEDIPINE ER 60 MG PO TB24: 60.0000 mg | ORAL_TABLET | Freq: Two times a day (BID) | ORAL | 0 refills | Status: DC

## 2020-05-31 MED ORDER — IBUPROFEN 600 MG PO TABS
600.0000 mg | ORAL_TABLET | Freq: Four times a day (QID) | ORAL | 0 refills | Status: DC
Start: 2020-05-31 — End: 2021-08-10

## 2020-05-31 NOTE — Discharge Summary (Signed)
Postpartum Discharge Summary     Patient Name: Rebecca Orozco DOB: 03/24/1981 MRN: [id number]  Date of admission: 05/28/2020 Delivery date:05/29/2020  Delivering provider: Mitchel Honour  Date of discharge: 05/31/2020  Admitting diagnosis: Chronic hypertension affecting pregnancy [O10.919] Pre-eclampsia superimposed on chronic hypertension [O11.9] Intrauterine pregnancy: [redacted]w[redacted]d     Secondary diagnosis:  Active Problems:   Chronic hypertension affecting pregnancy   Pre-eclampsia superimposed on chronic hypertension  Diagnosis: chronic hypertension with super-imposed preeclampsia, s/p vaginal delivery and postpartum magnesium infusion.                           Complications: None  Physical exam  Vitals:   05/30/20 2339 05/31/20 0445 05/31/20 0818 05/31/20 1208  BP: 118/69 131/73 (!) 144/83 131/76  Pulse: 83 94 85 100  Resp: 20 20 20 19   Temp: 98.7 F (37.1 C) 98.6 F (37 C) 98.8 F (37.1 C) 98.1 F (36.7 C)  TempSrc: Oral Oral Oral Oral  SpO2: 94% 97% 97% 100%  Weight:      Height:       Labs: Lab Results  Component Value Date   WBC 13.0 (H) 05/29/2020   HGB 10.3 (L) 05/29/2020   HCT 32.2 (L) 05/29/2020   MCV 90.4 05/29/2020   PLT 148 (L) 05/29/2020   CMP Latest Ref Rng & Units 05/29/2020  Glucose 70 - 99 mg/dL 05/31/2020)  BUN 6 - 20 mg/dL 6  Creatinine 128(N - 8.67 mg/dL 6.72  Sodium 0.94 - 709 mmol/L 131(L)  Potassium 3.5 - 5.1 mmol/L 3.8  Chloride 98 - 111 mmol/L 104  CO2 22 - 32 mmol/L 18(L)  Calcium 8.9 - 10.3 mg/dL 6.6(L)  Total Protein 6.5 - 8.1 g/dL 4.9(L)  Total Bilirubin 0.3 - 1.2 mg/dL 0.6  Alkaline Phos 38 - 126 U/L 106  AST 15 - 41 U/L 36  ALT 0 - 44 U/L 25   Edinburgh Score: No flowsheet data found.   After visit meds:  Allergies as of 05/31/2020      Reactions   Amoxicillin-pot Clavulanate Nausea And Vomiting   Latex Other (See Comments)   Propoxyphene Nausea And Vomiting      Medication List    STOP taking these medications   aspirin EC  81 MG tablet     TAKE these medications   CitraNatal Assure 35-1 & 300 MG tablet Take 1 tablet by mouth daily.   fluticasone 50 MCG/ACT nasal spray Commonly known as: FLONASE Place 1-2 sprays into both nostrils daily as needed for allergies or rhinitis.   ibuprofen 600 MG tablet Commonly known as: ADVIL Take 1 tablet (600 mg total) by mouth every 6 (six) hours.   labetalol 200 MG tablet Commonly known as: NORMODYNE Take 200 mg by mouth 2 (two) times daily.   NIFEdipine 60 MG 24 hr tablet Commonly known as: ADALAT CC Take 1 tablet (60 mg total) by mouth 2 (two) times daily.        Discharge home in stable condition Infant Disposition:home with mother Discharge instruction: per After Visit Summary and Postpartum booklet. Activity: Advance as tolerated. Pelvic rest for 6 weeks.  Diet: routine diet  Future Appointments:No future appointments. Follow up Visit:  Follow-up Information    [place] Follow up.   Why: Please call to make an appointment for blood pressure check in 1 week. Contact information: [address] [place] [phone]  Office to reach out to patient to schedule 1 week BP check.  Advised to continue prescribed procardia 60 mg XL BID and labetalol 200 mg BID and continue taking BP at home.  S/S of preeclampsia reviewed in detail.  All questions answered, patient discharged home in stable condition.  05/31/2020 Lyn Henri, MD

## 2020-05-31 NOTE — Progress Notes (Signed)
Post Partum Day 2 Subjective:  No complaints.  Ambulating and voiding well.  Lochia appropriate.  Pain well controlled. Denies HA, vision changes, RUQ or epigastric pain. Feeling much better overall after restful night.  Objective: Blood pressure 131/73, pulse 94, temperature 98.6 F (37 C), temperature source Oral, resp. rate 20, height 5\' 6"  (1.676 m), weight 117 kg, last menstrual period 09/07/2019, SpO2 97 %, unknown if currently breastfeeding.  Physical Exam:  General: alert, cooperative and appears stated age Lochia: appropriate Uterine Fundus: firm Incision: healing well, no significant drainage, no dehiscence DVT Evaluation: No evidence of DVT seen on physical exam. Negative Homan's sign. No cords or calf tenderness.  Recent Labs    05/28/20 1019 05/29/20 0511  HGB 11.3* 10.3*  HCT 34.3* 32.2*    Assessment/Plan:  CHTN with super-imposed pre-eclampsia-S/P magnesium gtt.  Blood pressure control much improved with labetalol 200 BID and addition of Procardia, current dosing 60 mg XL BID.  Will continue close blood pressure monitoring and likely discharge home this afternoon.   LOS: 3 days   05/31/20 05/31/2020, 7:52 AM

## 2020-05-31 NOTE — Plan of Care (Signed)
  Problem: Education: Goal: Knowledge of General Education information will improve Description: Including pain rating scale, medication(s)/side effects and non-pharmacologic comfort measures Outcome: Completed/Met   Problem: Education: Goal: Knowledge of Childbirth will improve Outcome: Completed/Met Goal: Ability to make informed decisions regarding treatment and plan of care will improve Outcome: Completed/Met Goal: Ability to state and carry out methods to decrease the pain will improve Outcome: Completed/Met Goal: Individualized Educational Video(s) Outcome: Completed/Met   Problem: Coping: Goal: Ability to verbalize concerns and feelings about labor and delivery will improve Outcome: Completed/Met   Problem: Life Cycle: Goal: Ability to make normal progression through stages of labor will improve Outcome: Completed/Met Goal: Ability to effectively push during vaginal delivery will improve Outcome: Completed/Met   Problem: Role Relationship: Goal: Will demonstrate positive interactions with the child Outcome: Completed/Met   Problem: Safety: Goal: Risk of complications during labor and delivery will decrease Outcome: Completed/Met   Problem: Pain Management: Goal: Relief or control of pain from uterine contractions will improve Outcome: Completed/Met   

## 2020-05-31 NOTE — Discharge Instructions (Signed)
Hypertension During Pregnancy Hypertension is also called high blood pressure. High blood pressure means that the force of your blood moving in your body is too strong. It can cause problems for you and your baby. Different types of high blood pressure can happen during pregnancy. The types are:  High blood pressure before you got pregnant. This is called chronic hypertension.  This can continue during your pregnancy. Your doctor will want to keep checking your blood pressure. You may need medicine to keep your blood pressure under control while you are pregnant. You will need follow-up visits after you have your baby.  High blood pressure that goes up during pregnancy when it was normal before. This is called gestational hypertension. It will usually get better after you have your baby, but your doctor will need to watch your blood pressure to make sure that it is getting better.  Very high blood pressure during pregnancy. This is called preeclampsia. Very high blood pressure is an emergency that needs to be checked and treated right away.  You may develop very high blood pressure after giving birth. This is called postpartum preeclampsia. This usually occurs within 48 hours after childbirth but may occur up to 6 weeks after giving birth. This is rare. How does this affect me? If you have high blood pressure during pregnancy, you have a higher chance of developing high blood pressure:  As you get older.  If you get pregnant again. In some cases, high blood pressure during pregnancy can cause:  Stroke.  Heart attack.  Damage to the kidneys, lungs, or liver.  Preeclampsia.  Jerky movements you cannot control (convulsions or seizures).  Problems with the placenta. How does this affect my baby? Your baby may:  Be born early.  Not weigh as much as he or she should.  Not handle labor well, leading to a c-section birth. What are the risks?  Having high blood pressure during a past  pregnancy.  Being overweight.  Being 35 years old or older.  Being pregnant for the first time.  Being pregnant with more than one baby.  Becoming pregnant using fertility methods, such as IVF.  Having other problems, such as diabetes, or kidney disease.  Having family members who have high blood pressure. What can I do to lower my risk?   Keep a healthy weight.  Eat a healthy diet.  Follow what your doctor tells you about treating any medical problems that you had before becoming pregnant. It is very important to go to all of your doctor visits. Your doctor will check your blood pressure and make sure that your pregnancy is progressing as it should. Treatment should start early if a problem is found. How is this treated? Treatment for high blood pressure during pregnancy can differ depending on the type of high blood pressure you have and how serious it is.  You may need to take blood pressure medicine.  If you have been taking medicine for your blood pressure, you may need to change the medicine during pregnancy if it is not safe for your baby.  If your doctor thinks that you could get very high blood pressure, he or she may tell you to take a low-dose aspirin during your pregnancy.  If you have very high blood pressure, you may need to stay in the hospital so you and your baby can be watched closely. You may also need to take medicine to lower your blood pressure. This medicine may be given by mouth   or through an IV tube.  In some cases, if your condition gets worse, you may need to have your baby early. Follow these instructions at home: Eating and drinking   Drink enough fluid to keep your pee (urine) pale yellow.  Avoid caffeine. Lifestyle  Do not use any products that contain nicotine or tobacco, such as cigarettes, e-cigarettes, and chewing tobacco. If you need help quitting, ask your doctor.  Do not use alcohol or drugs.  Avoid stress.  Rest and get plenty  of sleep.  Regular exercise can help. Ask your doctor what kinds of exercise are best for you. General instructions  Take over-the-counter and prescription medicines only as told by your doctor.  Keep all prenatal and follow-up visits as told by your doctor. This is important. Contact a doctor if:  You have symptoms that your doctor told you to watch for, such as: ? Headaches. ? Nausea. ? Vomiting. ? Belly (abdominal) pain. ? Dizziness. ? Light-headedness. Get help right away if:  You have: ? Very bad belly pain that does not get better with treatment. ? A very bad headache that does not get better. ? Vomiting that does not get better. ? Sudden, fast weight gain. ? Sudden swelling in your hands, ankles, or face. ? Bleeding from your vagina. ? Blood in your pee. ? Blurry vision. ? Double vision. ? Shortness of breath. ? Chest pain. ? Weakness on one side of your body. ? Trouble talking.  Your baby is not moving as much as usual. Summary  High blood pressure is also called hypertension.  High blood pressure means that the force of your blood moving in your body is too strong.  High blood pressure can cause problems for you and your baby.  Keep all follow-up visits as told by your doctor. This is important. This information is not intended to replace advice given to you by your health care provider. Make sure you discuss any questions you have with your health care provider. Document Revised: 12/19/2018 Document Reviewed: 09/24/2018 Elsevier Patient Education  2020 [employer] Vaginal Delivery  Vaginal delivery means that you give birth by pushing your baby out of your birth canal (vagina). A team of health care providers will help you before, during, and after vaginal delivery. Birth experiences are unique for every woman and every pregnancy, and birth experiences vary depending on where you choose to give birth. What happens when I arrive at the birth center or  hospital? Once you are in labor and have been admitted into the hospital or birth center, your health care provider may:  Review your pregnancy history and any concerns that you have.  Insert an IV into one of your veins. This may be used to give you fluids and medicines.  Check your blood pressure, pulse, temperature, and heart rate (vital signs).  Check whether your bag of water (amniotic sac) has broken (ruptured).  Talk with you about your birth plan and discuss pain control options. Monitoring Your health care provider may monitor your contractions (uterine monitoring) and your baby's heart rate (fetal monitoring). You may need to be monitored:  Often, but not continuously (intermittently).  All the time or for long periods at a time (continuously). Continuous monitoring may be needed if: ? You are taking certain medicines, such as medicine to relieve pain or make your contractions stronger. ? You have pregnancy or labor complications. Monitoring may be done by:  Placing a special stethoscope or a handheld monitoring device on  your abdomen to check your baby's heartbeat and to check for contractions.  Placing monitors on your abdomen (external monitors) to record your baby's heartbeat and the frequency and length of contractions.  Placing monitors inside your uterus through your vagina (internal monitors) to record your baby's heartbeat and the frequency, length, and strength of your contractions. Depending on the type of monitor, it may remain in your uterus or on your baby's head until birth.  Telemetry. This is a type of continuous monitoring that can be done with external or internal monitors. Instead of having to stay in bed, you are able to move around during telemetry. Physical exam Your health care provider may perform frequent physical exams. This may include:  Checking how and where your baby is positioned in your uterus.  Checking your cervix to determine: ? Whether  it is thinning out (effacing). ? Whether it is opening up (dilating). What happens during labor and delivery?  Normal labor and delivery is divided into the following three stages: Stage 1  This is the longest stage of labor.  This stage can last for hours or days.  Throughout this stage, you will feel contractions. Contractions generally feel mild, infrequent, and irregular at first. They get stronger, more frequent (about every 2-3 minutes), and more regular as you move through this stage.  This stage ends when your cervix is completely dilated to 4 inches (10 cm) and completely effaced. Stage 2  This stage starts once your cervix is completely effaced and dilated and lasts until the delivery of your baby.  This stage may last from 20 minutes to 2 hours.  This is the stage where you will feel an urge to push your baby out of your vagina.  You may feel stretching and burning pain, especially when the widest part of your baby's head passes through the vaginal opening (crowning).  Once your baby is delivered, the umbilical cord will be clamped and cut. This usually occurs after waiting a period of 1-2 minutes after delivery.  Your baby will be placed on your bare chest (skin-to-skin contact) in an upright position and covered with a warm blanket. Watch your baby for feeding cues, like rooting or sucking, and help the baby to your breast for his or her first feeding. Stage 3  This stage starts immediately after the birth of your baby and ends after you deliver the placenta.  This stage may take anywhere from 5 to 30 minutes.  After your baby has been delivered, you will feel contractions as your body expels the placenta and your uterus contracts to control bleeding. What can I expect after labor and delivery?  After labor is over, you and your baby will be monitored closely until you are ready to go home to ensure that you are both healthy. Your health care team will teach you how  to care for yourself and your baby.  You and your baby will stay in the same room (rooming in) during your hospital stay. This will encourage early bonding and successful breastfeeding.  You may continue to receive fluids and medicines through an IV.  Your uterus will be checked and massaged regularly (fundal massage).  You will have some soreness and pain in your abdomen, vagina, and the area of skin between your vaginal opening and your anus (perineum).  If an incision was made near your vagina (episiotomy) or if you had some vaginal tearing during delivery, cold compresses may be placed on your episiotomy or your  tear. This helps to reduce pain and swelling.  You may be given a squirt bottle to use instead of wiping when you go to the bathroom. To use the squirt bottle, follow these steps: ? Before you urinate, fill the squirt bottle with warm water. Do not use hot water. ? After you urinate, while you are sitting on the toilet, use the squirt bottle to rinse the area around your urethra and vaginal opening. This rinses away any urine and blood. ? Fill the squirt bottle with clean water every time you use the bathroom.  It is normal to have vaginal bleeding after delivery. Wear a sanitary pad for vaginal bleeding and discharge. Summary  Vaginal delivery means that you will give birth by pushing your baby out of your birth canal (vagina).  Your health care provider may monitor your contractions (uterine monitoring) and your baby's heart rate (fetal monitoring).  Your health care provider may perform a physical exam.  Normal labor and delivery is divided into three stages.  After labor is over, you and your baby will be monitored closely until you are ready to go home. This information is not intended to replace advice given to you by your health care provider. Make sure you discuss any questions you have with your health care provider. Document Revised: 10/02/2017 Document Reviewed:  10/02/2017 Elsevier Patient Education  2020 [employer].

## 2020-06-01 LAB — SURGICAL PATHOLOGY

## 2020-06-03 ENCOUNTER — Inpatient Hospital Stay (HOSPITAL_COMMUNITY): Payer: BC Managed Care – PPO

## 2020-12-14 ENCOUNTER — Encounter: Payer: Self-pay | Admitting: Primary Care

## 2021-06-30 LAB — HM MAMMOGRAPHY

## 2021-08-10 ENCOUNTER — Other Ambulatory Visit: Payer: Self-pay

## 2021-08-10 ENCOUNTER — Ambulatory Visit: Payer: BC Managed Care – PPO | Admitting: Primary Care

## 2021-08-10 ENCOUNTER — Encounter: Payer: Self-pay | Admitting: Primary Care

## 2021-08-10 VITALS — BP 120/80 | HR 79 | Temp 98.0°F | Ht 66.0 in | Wt 200.0 lb

## 2021-08-10 DIAGNOSIS — J3489 Other specified disorders of nose and nasal sinuses: Secondary | ICD-10-CM | POA: Insufficient documentation

## 2021-08-10 DIAGNOSIS — I10 Essential (primary) hypertension: Secondary | ICD-10-CM

## 2021-08-10 HISTORY — DX: Other specified disorders of nose and nasal sinuses: J34.89

## 2021-08-10 MED ORDER — AZITHROMYCIN 250 MG PO TABS: ORAL_TABLET | ORAL | 0 refills | Status: DC

## 2021-08-10 MED ORDER — LABETALOL HCL 200 MG PO TABS: 200.0000 mg | ORAL_TABLET | Freq: Two times a day (BID) | ORAL | 0 refills | Status: DC

## 2021-08-10 NOTE — Assessment & Plan Note (Signed)
Controlled in the office today, refill provided of labetalol 200 mg BID.

## 2021-08-10 NOTE — Patient Instructions (Signed)
Start Azithromycin antibiotics for infection. Take 2 tablets by mouth today, then 1 tablet daily for 4 additional days.  Nasal Congestion/Ear Pressure/Sinus Pressure: Try using Flonase (fluticasone) nasal spray. Instill 1 spray in each nostril twice daily.   It was a pleasure to see you today!  

## 2021-08-10 NOTE — Assessment & Plan Note (Signed)
Symptoms either post viral from Covid-19 or sinusitis. Given duration of symptoms, coupled with exam, will treat for resumed sinusitis.  Allergy to Augmentin. Rx for azithromycin sent to pharmacy.  Discussed use of Flonase, Tylenol/Ibuprofen.   Follow up PRN.

## 2021-08-10 NOTE — Progress Notes (Signed)
Subjective:    Patient ID: [place] Vogelgesang, female    DOB: 1980/11/04, 40 y.o.   MRN: [id number]  HPI  [place] Game is a very pleasant 40 y.o. female with a history of Covid-19, hypertension who presents today to discuss sinus pressure and sore throat.  She is also needing a refill of her labetalol 200 mg BID.   Diagnosed with Covid-19 on 07/14/21, symptoms at the time consisted of fatigue, rhinorrhea, nasal congestion. These symptoms completely resolved.  Two weeks ago she developed a sore throat, nasal congestion, sinus pressure, mild dry cough.  She's tried [employer] rinses which will sometimes yield yellowish bloody discharge, Vicks nasal inhaler, Sudafed, Robitussin. No improvement.   She denies fevers, post nasal drip. History of sinusitis.   Review of Systems  Constitutional:  Negative for fatigue and fever.  HENT:  Positive for congestion, sinus pressure, sinus pain and sore throat. Negative for postnasal drip.   Respiratory:  Positive for cough.         Past Medical History:  Diagnosis Date   Anxiety and depression    Cervical dysplasia    Flu    flu meningitis as infant   History of meningitis    HTN (hypertension)    Migraines    Vaginal Pap smear, abnormal     Social History   Socioeconomic History   Marital status: Married    Spouse name: Not on file   Number of children: Not on file   Years of education: Not on file   Highest education level: Not on file  Occupational History   Not on file  Tobacco Use   Smoking status: Never   Smokeless tobacco: Never   Tobacco comments:    tobacco use - no  Vaping Use   Vaping Use: Never used  Substance and Sexual Activity   Alcohol use: Not Currently    Alcohol/week: 0.0 standard drinks   Drug use: No   Sexual activity: Not Currently  Other Topics Concern   Not on file  Social History Narrative   Full time teacher at [employer]; gets regular exercise.    Social Determinants of Health   Financial  Resource Strain: Not on file  Food Insecurity: Not on file  Transportation Needs: Not on file  Physical Activity: Not on file  Stress: Not on file  Social Connections: Not on file  Intimate Partner Violence: Not on file    Past Surgical History:  Procedure Laterality Date   TONSILLECTOMY     VARICOSE VEIN SURGERY Right     Family History  Problem Relation Age of Onset   Colon cancer Mother 57   Hypertension Mother     Allergies  Allergen Reactions   Amoxicillin-Pot Clavulanate Nausea And Vomiting   Latex Other (See Comments)   Propoxyphene Nausea And Vomiting    Current Outpatient Medications on File Prior to Visit  Medication Sig Dispense Refill   labetalol (NORMODYNE) 200 MG tablet Take 200 mg by mouth 2 (two) times daily.     sertraline (ZOLOFT) 100 MG tablet Take 100 mg by mouth daily.     traZODone (DESYREL) 100 MG tablet Take 100 mg by mouth at bedtime.     fluticasone (FLONASE) 50 MCG/ACT nasal spray Place 1-2 sprays into both nostrils daily as needed for allergies or rhinitis. (Patient not taking: Reported on 08/10/2021)     No current facility-administered medications on file prior to visit.    BP 120/80   Pulse 79  Temp 98 F (36.7 C) (Temporal)   Ht 5\' 6"  (1.676 m)   Wt 200 lb (90.7 kg)   SpO2 95%   BMI 32.28 kg/m  Objective:   Physical Exam HENT:     Right Ear: Tympanic membrane and ear canal normal.     Left Ear: Tympanic membrane and ear canal normal.     Nose:     Right Sinus: Maxillary sinus tenderness present. No frontal sinus tenderness.     Left Sinus: Maxillary sinus tenderness present. No frontal sinus tenderness.     Mouth/Throat:     Pharynx: No posterior oropharyngeal erythema.  Eyes:     Conjunctiva/sclera: Conjunctivae normal.  Cardiovascular:     Rate and Rhythm: Normal rate and regular rhythm.  Pulmonary:     Effort: Pulmonary effort is normal.     Breath sounds: Normal breath sounds. No wheezing or rales.  Musculoskeletal:      Cervical back: Neck supple.  Lymphadenopathy:     Cervical: No cervical adenopathy.  Skin:    General: Skin is warm and dry.          Assessment & Plan:      This visit occurred during the SARS-CoV-2 public health emergency.  Safety protocols were in place, including screening questions prior to the visit, additional usage of staff PPE, and extensive cleaning of exam room while observing appropriate contact time as indicated for disinfecting solutions.

## 2021-08-18 ENCOUNTER — Encounter: Payer: Self-pay | Admitting: Primary Care

## 2021-11-09 ENCOUNTER — Encounter: Payer: BC Managed Care – PPO | Admitting: Primary Care

## 2021-11-15 ENCOUNTER — Ambulatory Visit (INDEPENDENT_AMBULATORY_CARE_PROVIDER_SITE_OTHER): Payer: BC Managed Care – PPO | Admitting: Primary Care

## 2021-11-15 ENCOUNTER — Other Ambulatory Visit: Payer: Self-pay

## 2021-11-15 ENCOUNTER — Encounter: Payer: Self-pay | Admitting: Primary Care

## 2021-11-15 VITALS — BP 118/64 | HR 82 | Temp 98.6°F | Ht 66.0 in | Wt 203.0 lb

## 2021-11-15 DIAGNOSIS — Z0000 Encounter for general adult medical examination without abnormal findings: Secondary | ICD-10-CM | POA: Insufficient documentation

## 2021-11-15 DIAGNOSIS — G4700 Insomnia, unspecified: Secondary | ICD-10-CM

## 2021-11-15 DIAGNOSIS — F341 Dysthymic disorder: Secondary | ICD-10-CM | POA: Diagnosis not present

## 2021-11-15 DIAGNOSIS — I10 Essential (primary) hypertension: Secondary | ICD-10-CM

## 2021-11-15 MED ORDER — LABETALOL HCL 200 MG PO TABS: 200.0000 mg | ORAL_TABLET | Freq: Two times a day (BID) | ORAL | 3 refills | Status: DC

## 2021-11-15 NOTE — Assessment & Plan Note (Signed)
Controlled. ? ?Continue labetalol 200 mg BID. ?

## 2021-11-15 NOTE — Assessment & Plan Note (Addendum)
Tetanus UTD. ?Pap smear UTD. ?Mammogram UTD. ? ?Discussed the importance of a healthy diet and regular exercise in order for weight loss, and to reduce the risk of further co-morbidity. ? ?Exam today stable, labs to be completed per psychiatry. She will send [place] a copy. ?

## 2021-11-15 NOTE — Progress Notes (Signed)
? ?Subjective:  ? ? Patient ID: Rebecca Orozco, female    DOB: 01/05/1981, 41 y.o.   MRN: [id number] ? ?HPI ? ?Rebecca Orozco is a very pleasant 41 y.o. female who presents today for complete physical and follow up of chronic conditions. ? ?Immunizations: ?-Tetanus: 2021 ?-Influenza: Did not complete ?-Covid-19: Has not completed ? ?Diet: Fair diet.  ?Exercise: No regular exercise.  ? ?Eye exam: Completed several years ago. ?Dental exam: Completes semi-annually  ? ?Pap Smear: Completed in 2021 ?Mammogram: Completed in October 2022 ? ? ? ? ? ? ?Review of Systems  ?Constitutional:  Negative for unexpected weight change.  ?HENT:  Negative for rhinorrhea.   ?Eyes:  Negative for visual disturbance.  ?Respiratory:  Negative for cough and shortness of breath.   ?Cardiovascular:  Negative for chest pain.  ?Gastrointestinal:  Negative for constipation and diarrhea.  ?Genitourinary:  Negative for difficulty urinating.  ?Musculoskeletal:  Negative for arthralgias and myalgias.  ?Skin:  Negative for rash.  ?Allergic/Immunologic: Negative for environmental allergies.  ?Neurological:  Negative for dizziness and headaches.  ?Psychiatric/Behavioral:  The patient is not nervous/anxious.   ? ?   ? ? ?Past Medical History:  ?Diagnosis Date  ? Anxiety and depression   ? Cervical dysplasia   ? Chronic hypertension affecting pregnancy 05/18/2020  ? Flu   ? flu meningitis as infant  ? History of meningitis   ? HTN (hypertension)   ? Migraines   ? Sinus pressure 08/10/2021  ? Vaginal Pap smear, abnormal   ? ? ?Social History  ? ?Socioeconomic History  ? Marital status: Married  ?  Spouse name: Not on file  ? Number of children: Not on file  ? Years of education: Not on file  ? Highest education level: Not on file  ?Occupational History  ? Not on file  ?Tobacco Use  ? Smoking status: Never  ? Smokeless tobacco: Never  ? Tobacco comments:  ?  tobacco use - no  ?Vaping Use  ? Vaping Use: Never used  ?Substance and Sexual Activity  ? Alcohol use: Not  Currently  ?  Alcohol/week: 0.0 standard drinks  ? Drug use: No  ? Sexual activity: Not Currently  ?Other Topics Concern  ? Not on file  ?Social History Narrative  ? Full time teacher at [employer]; gets regular exercise.   ? ?Social Determinants of Health  ? ?Financial Resource Strain: Not on file  ?Food Insecurity: Not on file  ?Transportation Needs: Not on file  ?Physical Activity: Not on file  ?Stress: Not on file  ?Social Connections: Not on file  ?Intimate Partner Violence: Not on file  ? ? ?Past Surgical History:  ?Procedure Laterality Date  ? TONSILLECTOMY    ? VARICOSE VEIN SURGERY Right   ? ? ?Family History  ?Problem Relation Age of Onset  ? Colon cancer Mother 79  ? Hypertension Mother   ? ? ?Allergies  ?Allergen Reactions  ? Amoxicillin-Pot Clavulanate Nausea And Vomiting  ? Latex Other (See Comments)  ? Propoxyphene Nausea And Vomiting  ? ? ?Current Outpatient Medications on File Prior to Visit  ?Medication Sig Dispense Refill  ? fluticasone (FLONASE) 50 MCG/ACT nasal spray Place 1-2 sprays into both nostrils daily as needed for allergies or rhinitis. (Patient not taking: Reported on 08/10/2021)    ? sertraline (ZOLOFT) 100 MG tablet Take 100 mg by mouth daily.    ? traZODone (DESYREL) 100 MG tablet Take 100 mg by mouth at bedtime.    ? ?No  current facility-administered medications on file prior to visit.  ? ? ?There were no vitals taken for this visit. ?Objective:  ? Physical Exam ?HENT:  ?   Right Ear: Tympanic membrane and ear canal normal.  ?   Left Ear: Tympanic membrane and ear canal normal.  ?   Nose: Nose normal.  ?Eyes:  ?   Conjunctiva/sclera: Conjunctivae normal.  ?   Pupils: Pupils are equal, round, and reactive to light.  ?Neck:  ?   Thyroid: No thyromegaly.  ?Cardiovascular:  ?   Rate and Rhythm: Normal rate and regular rhythm.  ?   Heart sounds: No murmur heard. ?Pulmonary:  ?   Effort: Pulmonary effort is normal.  ?   Breath sounds: Normal breath sounds. No rales.  ?Abdominal:  ?    General: Bowel sounds are normal.  ?   Palpations: Abdomen is soft.  ?   Tenderness: There is no abdominal tenderness.  ?Musculoskeletal:     ?   General: Normal range of motion.  ?   Cervical back: Neck supple.  ?Lymphadenopathy:  ?   Cervical: No cervical adenopathy.  ?Skin: ?   General: Skin is warm and dry.  ?   Findings: No rash.  ?Neurological:  ?   Mental Status: She is alert and oriented to person, place, and time.  ?   Cranial Nerves: No cranial nerve deficit.  ?   Deep Tendon Reflexes: Reflexes are normal and symmetric.  ?Psychiatric:     ?   Mood and Affect: Mood normal.  ? ? ? ? ? ?   ?Assessment & Plan:  ? ? ? ? ?This visit occurred during the SARS-CoV-2 public health emergency.  Safety protocols were in place, including screening questions prior to the visit, additional usage of staff PPE, and extensive cleaning of exam room while observing appropriate contact time as indicated for disinfecting solutions.  ?

## 2021-11-15 NOTE — Assessment & Plan Note (Signed)
Following with psychiatry. ? ?Continue Trazodone 100 mg HS. ?

## 2021-11-15 NOTE — Assessment & Plan Note (Signed)
Following with psychiatry. ?Continue Zoloft 100 mg daily, Trazodone 100 mg HS.  ? ?Feels well managed.  ?

## 2021-11-15 NOTE — Patient Instructions (Signed)
It was a pleasure to see you today! ? ?Preventive Care 42-42 Years Old, Female ?Preventive care refers to lifestyle choices and visits with your health care provider that can promote health and wellness. Preventive care visits are also called wellness exams. ?What can I expect for my preventive care visit? ?Counseling ?Your health care provider may ask you questions about your: ?Medical history, including: ?Past medical problems. ?Family medical history. ?Pregnancy history. ?Current health, including: ?Menstrual cycle. ?Method of birth control. ?Emotional well-being. ?Home life and relationship well-being. ?Sexual activity and sexual health. ?Lifestyle, including: ?Alcohol, nicotine or tobacco, and drug use. ?Access to firearms. ?Diet, exercise, and sleep habits. ?Work and work Statistician. ?Sunscreen use. ?Safety issues such as seatbelt and bike helmet use. ?Physical exam ?Your health care provider will check your: ?Height and weight. These may be used to calculate your BMI (body mass index). BMI is a measurement that tells if you are at a healthy weight. ?Waist circumference. This measures the distance around your waistline. This measurement also tells if you are at a healthy weight and may help predict your risk of certain diseases, such as type 2 diabetes and high blood pressure. ?Heart rate and blood pressure. ?Body temperature. ?Skin for abnormal spots. ?What immunizations do I need? ?Vaccines are usually given at various ages, according to a schedule. Your health care provider will recommend vaccines for you based on your age, medical history, and lifestyle or other factors, such as travel or where you work. ?What tests do I need? ?Screening ?Your health care provider may recommend screening tests for certain conditions. This may include: ?Lipid and cholesterol levels. ?Diabetes screening. This is done by checking your blood sugar (glucose) after you have not eaten for a while (fasting). ?Pelvic exam and Pap  test. ?Hepatitis B test. ?Hepatitis C test. ?HIV (human immunodeficiency virus) test. ?STI (sexually transmitted infection) testing, if you are at risk. ?Lung cancer screening. ?Colorectal cancer screening. ?Mammogram. Talk with your health care provider about when you should start having regular mammograms. This may depend on whether you have a family history of breast cancer. ?BRCA-related cancer screening. This may be done if you have a family history of breast, ovarian, tubal, or peritoneal cancers. ?Bone density scan. This is done to screen for osteoporosis. ?Talk with your health care provider about your test results, treatment options, and if necessary, the need for more tests. ?Follow these instructions at home: ?Eating and drinking ? ?Eat a diet that includes fresh fruits and vegetables, whole grains, lean protein, and low-fat dairy products. ?Take vitamin and mineral supplements as recommended by your health care provider. ?Do not drink alcohol if: ?Your health care provider tells you not to drink. ?You are pregnant, may be pregnant, or are planning to become pregnant. ?If you drink alcohol: ?Limit how much you have to 0-1 drink a day. ?Know how much alcohol is in your drink. In the [place] one drink equals one 12 oz bottle of beer (355 mL), one 5 oz glass of wine (148 mL), or one 1? oz glass of hard liquor (44 mL). ?Lifestyle ?Brush your teeth every morning and night with fluoride toothpaste. Floss one time each day. ?Exercise for at least 30 minutes 5 or more days each week. ?Do not use any products that contain nicotine or tobacco. These products include cigarettes, chewing tobacco, and vaping devices, such as e-cigarettes. If you need help quitting, ask your health care provider. ?Do not use drugs. ?If you are sexually active, practice safe sex. Use  a condom or other form of protection to prevent STIs. ?If you do not wish to become pregnant, use a form of birth control. If you plan to become pregnant,  see your health care provider for a prepregnancy visit. ?Take aspirin only as told by your health care provider. Make sure that you understand how much to take and what form to take. Work with your health care provider to find out whether it is safe and beneficial for you to take aspirin daily. ?Find healthy ways to manage stress, such as: ?Meditation, yoga, or listening to music. ?Journaling. ?Talking to a trusted person. ?Spending time with friends and family. ?Minimize exposure to UV radiation to reduce your risk of skin cancer. ?Safety ?Always wear your seat belt while driving or riding in a vehicle. ?Do not drive: ?If you have been drinking alcohol. Do not ride with someone who has been drinking. ?When you are tired or distracted. ?While texting. ?If you have been using any mind-altering substances or drugs. ?Wear a helmet and other protective equipment during sports activities. ?If you have firearms in your house, make sure you follow all gun safety procedures. ?Seek help if you have been physically or sexually abused. ?What's next? ?Visit your health care provider once a year for an annual wellness visit. ?Ask your health care provider how often you should have your eyes and teeth checked. ?Stay up to date on all vaccines. ?This information is not intended to replace advice given to you by your health care provider. Make sure you discuss any questions you have with your health care provider. ?Document Revised: 02/23/2021 Document Reviewed: 02/23/2021 ?Elsevier Patient Education ? [place]. ? ?

## 2022-02-08 ENCOUNTER — Encounter: Payer: Self-pay | Admitting: Primary Care

## 2022-02-08 ENCOUNTER — Ambulatory Visit: Payer: BC Managed Care – PPO | Admitting: Primary Care

## 2022-02-08 VITALS — BP 126/74 | HR 76 | Temp 98.6°F | Ht 66.0 in | Wt 209.0 lb

## 2022-02-08 DIAGNOSIS — E6609 Other obesity due to excess calories: Secondary | ICD-10-CM

## 2022-02-08 DIAGNOSIS — Z6833 Body mass index (BMI) 33.0-33.9, adult: Secondary | ICD-10-CM | POA: Diagnosis not present

## 2022-02-08 DIAGNOSIS — I10 Essential (primary) hypertension: Secondary | ICD-10-CM

## 2022-02-08 DIAGNOSIS — E66813 Obesity, class 3: Secondary | ICD-10-CM | POA: Insufficient documentation

## 2022-02-08 MED ORDER — OZEMPIC (0.25 OR 0.5 MG/DOSE) 2 MG/3ML ~~LOC~~ SOPN: PEN_INJECTOR | SUBCUTANEOUS | 0 refills | Status: DC

## 2022-02-08 NOTE — Assessment & Plan Note (Signed)
Long discussion today regarding the need to increase consumption of veggies, fruit, lean protein, and exercise.  Agree to start Ozpemic.  0.25 mg weekly x4 weeks, then increase to 0.5 mg weekly thereafter.  We discussed common side effects including constipation, GERD, pancreatitis, etc.  She will schedule follow-up visit for 2 months after she starts the Ozempic.  Checking labs today.

## 2022-02-08 NOTE — Patient Instructions (Signed)
Stop by the lab prior to leaving today. I will notify you of your results once received.   Start semaglutide (Ozempic).  Inject 0.25 mg into the skin once weekly for 4 weeks, then increase to 0.5 mg weekly thereafter.  Get to a follow-up visit for 2 months after you start your Ozempic medication.  It was a pleasure to see you today!

## 2022-02-08 NOTE — Progress Notes (Signed)
Subjective:    Patient ID: Rebecca Orozco, female    DOB: 1981/02/29, 41 y.o.   MRN: [id number]  HPI  Rebecca Orozco is a very pleasant 41 y.o. female with a history of hypertension, anxiety/depression obesity who presents today to discuss weight loss options.   Evaluated by her psychiatrist recently who recommended she reach out to her PCP for an injectable weight loss. She was advised to come in for discussion.   Today she is interested in weight loss treatment as she's consistently gained weight, has trouble with portion sizes.   She's tried [place] which helps her to lose 5-10 pounds but has become cost prohibitive. She's also tried weight watchers in the past, wasn't successful as she couldn't keep track. She has also tried the Special K diet, lost some weight, but the plan was not sustainable for her. She was once managed on Adipex, lost 30 pounds at the time.   Diet currently consists of:  Breakfast: Muscle Milk  Lunch: Salad - homemade  Dinner: Protein bar, tortilla/ham wrap, restaurant food once weekly on average Snacks: None Desserts: Daily  Beverages: Coke Zero, water, flavored water  Exercise: Walking    Wt Readings from Last 3 Encounters:  02/08/22 209 lb (94.8 kg)  11/15/21 203 lb (92.1 kg)  08/10/21 200 lb (90.7 kg)     BP Readings from Last 3 Encounters:  02/08/22 126/74  11/15/21 118/64  08/10/21 120/80    Review of Systems  Respiratory:  Negative for shortness of breath.   Cardiovascular:  Negative for chest pain.  Gastrointestinal:  Negative for abdominal pain and nausea.        Past Medical History:  Diagnosis Date   Anxiety and depression    Cervical dysplasia    Chronic hypertension affecting pregnancy 05/18/2020   Flu    flu meningitis as infant   History of meningitis    HTN (hypertension)    Migraines    Sinus pressure 08/10/2021   Vaginal Pap smear, abnormal     Social History   Socioeconomic History   Marital status: Married     Spouse name: Not on file   Number of children: Not on file   Years of education: Not on file   Highest education level: Not on file  Occupational History   Not on file  Tobacco Use   Smoking status: Never   Smokeless tobacco: Never   Tobacco comments:    tobacco use - no  Vaping Use   Vaping Use: Never used  Substance and Sexual Activity   Alcohol use: Not Currently    Alcohol/week: 0.0 standard drinks   Drug use: No   Sexual activity: Not Currently  Other Topics Concern   Not on file  Social History Narrative   Full time teacher at [employer]; gets regular exercise.    Social Determinants of Health   Financial Resource Strain: Not on file  Food Insecurity: Not on file  Transportation Needs: Not on file  Physical Activity: Not on file  Stress: Not on file  Social Connections: Not on file  Intimate Partner Violence: Not on file    Past Surgical History:  Procedure Laterality Date   TONSILLECTOMY     VARICOSE VEIN SURGERY Right     Family History  Problem Relation Age of Onset   Colon cancer Mother 22   Hypertension Mother     Allergies  Allergen Reactions   Amoxicillin-Pot Clavulanate Nausea And Vomiting   Latex Other (See  Comments)   Propoxyphene Nausea And Vomiting    Current Outpatient Medications on File Prior to Visit  Medication Sig Dispense Refill   fluticasone (FLONASE) 50 MCG/ACT nasal spray Place 1-2 sprays into both nostrils daily as needed for allergies or rhinitis.     labetalol (NORMODYNE) 200 MG tablet Take 1 tablet (200 mg total) by mouth 2 (two) times daily. For blood pressure. 180 tablet 3   sertraline (ZOLOFT) 100 MG tablet Take 100 mg by mouth daily.     traZODone (DESYREL) 100 MG tablet Take 100 mg by mouth at bedtime.     No current facility-administered medications on file prior to visit.    BP 126/74   Pulse 76   Temp 98.6 F (37 C) (Oral)   Ht 5\' 6"  (1.676 m)   Wt 209 lb (94.8 kg)   SpO2 97%   BMI 33.73 kg/m   Objective:   Physical Exam Cardiovascular:     Rate and Rhythm: Normal rate and regular rhythm.  Pulmonary:     Effort: Pulmonary effort is normal.     Breath sounds: Normal breath sounds.  Musculoskeletal:     Cervical back: Neck supple.  Skin:    General: Skin is warm and dry.  Neurological:     Mental Status: She is alert.          Assessment & Plan:

## 2022-02-09 LAB — COMPREHENSIVE METABOLIC PANEL
ALT: 23 IU/L (ref 0–32)
AST: 25 IU/L (ref 0–40)
Albumin/Globulin Ratio: 1.7 (ref 1.2–2.2)
Albumin: 4.5 g/dL (ref 3.8–4.8)
Alkaline Phosphatase: 58 IU/L (ref 44–121)
BUN/Creatinine Ratio: 25 — ABNORMAL HIGH (ref 9–23)
BUN: 19 mg/dL (ref 6–24)
Bilirubin Total: 0.6 mg/dL (ref 0.0–1.2)
CO2: 22 mmol/L (ref 20–29)
Calcium: 9.6 mg/dL (ref 8.7–10.2)
Chloride: 97 mmol/L (ref 96–106)
Creatinine, Ser: 0.76 mg/dL (ref 0.57–1.00)
Globulin, Total: 2.7 g/dL (ref 1.5–4.5)
Glucose: 85 mg/dL (ref 70–99)
Potassium: 4.1 mmol/L (ref 3.5–5.2)
Sodium: 136 mmol/L (ref 134–144)
Total Protein: 7.2 g/dL (ref 6.0–8.5)
eGFR: 101 mL/min/{1.73_m2} (ref >59)

## 2022-02-09 LAB — VITAMIN B12: Vitamin B-12: 698 pg/mL (ref 232–1245)

## 2022-02-09 LAB — TSH: TSH: 1.38 u[IU]/mL (ref 0.450–4.500)

## 2022-02-09 LAB — LIPID PANEL
Chol/HDL Ratio: 4.5 ratio — ABNORMAL HIGH (ref 0.0–4.4)
Cholesterol, Total: 199 mg/dL (ref 100–199)
HDL: 44 mg/dL (ref >39)
LDL Chol Calc (NIH): 128 mg/dL — ABNORMAL HIGH (ref 0–99)
Triglycerides: 153 mg/dL — ABNORMAL HIGH (ref 0–149)
VLDL Cholesterol Cal: 27 mg/dL (ref 5–40)

## 2022-02-09 LAB — CBC
Hematocrit: 37.3 % (ref 34.0–46.6)
Hemoglobin: 12.8 g/dL (ref 11.1–15.9)
MCH: 31.2 pg (ref 26.6–33.0)
MCHC: 34.3 g/dL (ref 31.5–35.7)
MCV: 91 fL (ref 79–97)
Platelets: 262 10*3/uL (ref 150–450)
RBC: 4.1 x10E6/uL (ref 3.77–5.28)
RDW: 11.9 % (ref 11.7–15.4)
WBC: 6.3 10*3/uL (ref 3.4–10.8)

## 2022-03-13 ENCOUNTER — Encounter: Payer: Self-pay | Admitting: Primary Care

## 2022-03-13 ENCOUNTER — Ambulatory Visit: Payer: BC Managed Care – PPO | Admitting: Primary Care

## 2022-03-13 DIAGNOSIS — J069 Acute upper respiratory infection, unspecified: Secondary | ICD-10-CM | POA: Diagnosis not present

## 2022-03-13 MED ORDER — BENZONATATE 200 MG PO CAPS: 200.0000 mg | ORAL_CAPSULE | Freq: Three times a day (TID) | ORAL | 0 refills | Status: DC | PRN

## 2022-03-13 MED ORDER — PREDNISONE 20 MG PO TABS: ORAL_TABLET | ORAL | 0 refills | Status: DC

## 2022-03-13 NOTE — Assessment & Plan Note (Signed)
Suspect viral etiology at this point.  Rx for prednisone 40 mg daily x 5 days provided for chest tightness, ear fullness. Rx for [employer] provided to use TID PRN cough.  Discussed other home care treatment.  She will update in a few days if no improvement.

## 2022-03-13 NOTE — Patient Instructions (Signed)
You may take Benzonatate capsules for cough. Take 1 capsule by mouth three times daily as needed for cough.  Start prednisone 20 mg tablets. Take 2 tablets by mouth once daily for 5 days.  Please update me Thursday if no improvement.  It was a pleasure to see you today!

## 2022-03-13 NOTE — Progress Notes (Signed)
Subjective:    Patient ID: Rebecca Orozco, female    DOB: [birth year], 41 y.o.   MRN: [id number]  HPI  Rebecca Orozco is a very pleasant 41 y.o. female with a history of who presents today to discuss otalgia.   Symptom onset five days ago with sore throat. She then developed nasal and head congestion, ear fullness, chest congestion, chest tightness.   Her daughter has a "double ear infection" and was sick with the same symptoms for 2 weeks. She is now under treatment.  Today she's feeling slightly better. She's been taking Robitussin DM and Coricidin. She's used Flonase a few times. She tested negative for Covid-19 recently.   Wt Readings from Last 3 Encounters:  03/13/22 202 lb (91.6 kg)  02/08/22 209 lb (94.8 kg)  11/15/21 203 lb (92.1 kg)       Review of Systems  Constitutional:  Negative for chills, fatigue and fever.  HENT:  Positive for congestion, postnasal drip and sore throat.   Respiratory:  Positive for cough and chest tightness. Negative for shortness of breath.   Neurological:  Positive for headaches.         Past Medical History:  Diagnosis Date   Anxiety and depression    Cervical dysplasia    Chronic hypertension affecting pregnancy 05/18/2020   Flu    flu meningitis as infant   History of meningitis    HTN (hypertension)    Migraines    Sinus pressure 08/10/2021   Vaginal Pap smear, abnormal     Social History   Socioeconomic History   Marital status: Married    Spouse name: Not on file   Number of children: Not on file   Years of education: Not on file   Highest education level: Not on file  Occupational History   Not on file  Tobacco Use   Smoking status: Never   Smokeless tobacco: Never   Tobacco comments:    tobacco use - no  Vaping Use   Vaping Use: Never used  Substance and Sexual Activity   Alcohol use: Not Currently    Alcohol/week: 0.0 standard drinks of alcohol   Drug use: No   Sexual activity: Not Currently  Other Topics Concern    Not on file  Social History Narrative   Full time teacher at [employer]; gets regular exercise.    Social Determinants of Health   Financial Resource Strain: Not on file  Food Insecurity: Not on file  Transportation Needs: Not on file  Physical Activity: Not on file  Stress: Not on file  Social Connections: Not on file  Intimate Partner Violence: Not on file    Past Surgical History:  Procedure Laterality Date   TONSILLECTOMY     VARICOSE VEIN SURGERY Right     Family History  Problem Relation Age of Onset   Colon cancer Mother 35   Hypertension Mother     Allergies  Allergen Reactions   Amoxicillin-Pot Clavulanate Nausea And Vomiting   Latex Other (See Comments)   Propoxyphene Nausea And Vomiting    Current Outpatient Medications on File Prior to Visit  Medication Sig Dispense Refill   fluticasone (FLONASE) 50 MCG/ACT nasal spray Place 1-2 sprays into both nostrils daily as needed for allergies or rhinitis.     labetalol (NORMODYNE) 200 MG tablet Take 1 tablet (200 mg total) by mouth 2 (two) times daily. For blood pressure. 180 tablet 3   Semaglutide,0.25 or 0.5MG /DOS, (OZEMPIC, 0.25 OR 0.5 MG/DOSE,) 2  MG/3ML SOPN Inject 0.25 mg into the skin once weekly x 4 weeks, then increase to 0.5 mg once weekly thereafter. 9 mL 0   sertraline (ZOLOFT) 100 MG tablet Take 100 mg by mouth daily.     traZODone (DESYREL) 100 MG tablet Take 100 mg by mouth at bedtime.     No current facility-administered medications on file prior to visit.    BP 118/76   Pulse 89   Temp 98.6 F (37 C) (Oral)   Ht 5\' 6"  (1.676 m)   Wt 202 lb (91.6 kg)   SpO2 97%   BMI 32.60 kg/m  Objective:   Physical Exam Constitutional:      Appearance: She is ill-appearing.  HENT:     Right Ear: Tympanic membrane and ear canal normal.     Left Ear: Tympanic membrane and ear canal normal.     Nose:     Right Sinus: No maxillary sinus tenderness or frontal sinus tenderness.     Left Sinus: No  maxillary sinus tenderness or frontal sinus tenderness.     Mouth/Throat:     Pharynx: No posterior oropharyngeal erythema.  Eyes:     Conjunctiva/sclera: Conjunctivae normal.  Cardiovascular:     Rate and Rhythm: Normal rate and regular rhythm.  Pulmonary:     Effort: Pulmonary effort is normal.     Breath sounds: Normal breath sounds. No wheezing or rales.     Comments: Congested sounding cough noted during exam Musculoskeletal:     Cervical back: Neck supple.  Lymphadenopathy:     Cervical: No cervical adenopathy.  Skin:    General: Skin is warm and dry.           Assessment & Plan:   Problem List Items Addressed This Visit       Respiratory   Viral URI with cough    Suspect viral etiology at this point.  Rx for prednisone 40 mg daily x 5 days provided for chest tightness, ear fullness. Rx for provided to use TID PRN cough.  Discussed other home care treatment.  She will update in a few days if no improvement.       Relevant Medications   benzonatate (TESSALON) 200 MG capsule   predniSONE (DELTASONE) 20 MG tablet       [employer], NP

## 2022-03-16 DIAGNOSIS — R051 Acute cough: Secondary | ICD-10-CM

## 2022-03-17 MED ORDER — AZITHROMYCIN 250 MG PO TABS: ORAL_TABLET | ORAL | 0 refills | Status: DC

## 2022-03-30 ENCOUNTER — Other Ambulatory Visit: Payer: Self-pay | Admitting: Nurse Practitioner

## 2022-03-30 DIAGNOSIS — N644 Mastodynia: Secondary | ICD-10-CM

## 2022-04-04 ENCOUNTER — Ambulatory Visit: Payer: BC Managed Care – PPO

## 2022-04-04 ENCOUNTER — Ambulatory Visit
Admission: RE | Admit: 2022-04-04 | Discharge: 2022-04-04 | Disposition: A | Discharge status: Home / Self Care | Payer: BC Managed Care – PPO | Source: Ambulatory Visit | Attending: Nurse Practitioner | Admitting: Nurse Practitioner

## 2022-04-04 DIAGNOSIS — N644 Mastodynia: Secondary | ICD-10-CM

## 2022-04-05 ENCOUNTER — Ambulatory Visit: Payer: BC Managed Care – PPO | Admitting: Primary Care

## 2022-04-05 DIAGNOSIS — Z6833 Body mass index (BMI) 33.0-33.9, adult: Secondary | ICD-10-CM

## 2022-04-05 DIAGNOSIS — E6609 Other obesity due to excess calories: Secondary | ICD-10-CM | POA: Diagnosis not present

## 2022-04-05 NOTE — Progress Notes (Signed)
Subjective:    Patient ID: Rebecca Orozco, female    DOB: May 23, 1981, 41 y.o.   MRN: [id number]  HPI  Rebecca Orozco is a very pleasant 41 y.o. female with a history of obesity, hypertension, anxiety/depression, who presents today for follow up of obesity.  Currently managed on semaglutide 0.25/0.5 mg weekly which was initiated in late May 2023 per her request.   Since her visit in May 2023 she's lost 8 pounds. She hasn't lost much within the last month. She is weighing herself at home and her scales have been the same.  She admits to little water intake. She has reduced her portion sizes. She denies significant GERD, constipation. She denies abdominal pain.   Diet currently consists of:  Breakfast: Protein bar or protein shake Lunch: Wrap Dinner: Sometimes skips, wrap, fruit, yogurt, protein Snacks: Occasionally almonds Desserts: None Beverages: Protein shake, some water, Coke Zero  Exercise: No regular exercise.    Wt Readings from Last 3 Encounters:  04/05/22 201 lb (91.2 kg)  03/13/22 202 lb (91.6 kg)  02/08/22 209 lb (94.8 kg)      Review of Systems  Respiratory:  Negative for shortness of breath.   Cardiovascular:  Negative for chest pain.  Gastrointestinal:  Negative for abdominal pain, constipation and nausea.  Neurological:  Negative for dizziness.         Past Medical History:  Diagnosis Date   Anxiety and depression    Cervical dysplasia    Chronic hypertension affecting pregnancy 05/18/2020   Flu    flu meningitis as infant   History of meningitis    HTN (hypertension)    Migraines    Sinus pressure 08/10/2021   Vaginal Pap smear, abnormal     Social History   Socioeconomic History   Marital status: Married    Spouse name: Not on file   Number of children: Not on file   Years of education: Not on file   Highest education level: Not on file  Occupational History   Not on file  Tobacco Use   Smoking status: Never   Smokeless tobacco: Never    Tobacco comments:    tobacco use - no  Vaping Use   Vaping Use: Never used  Substance and Sexual Activity   Alcohol use: Not Currently    Alcohol/week: 0.0 standard drinks of alcohol   Drug use: No   Sexual activity: Not Currently  Other Topics Concern   Not on file  Social History Narrative   Full time teacher at [employer]; gets regular exercise.    Social Determinants of Health   Financial Resource Strain: Not on file  Food Insecurity: Not on file  Transportation Needs: Not on file  Physical Activity: Not on file  Stress: Not on file  Social Connections: Not on file  Intimate Partner Violence: Not on file    Past Surgical History:  Procedure Laterality Date   TONSILLECTOMY     VARICOSE VEIN SURGERY Right     Family History  Problem Relation Age of Onset   Colon cancer Mother 58   Hypertension Mother    Breast cancer Paternal Aunt        late 76s    Allergies  Allergen Reactions   Amoxicillin-Pot Clavulanate Nausea And Vomiting   Latex Other (See Comments)   Propoxyphene Nausea And Vomiting    Current Outpatient Medications on File Prior to Visit  Medication Sig Dispense Refill   azithromycin (ZITHROMAX) 250 MG tablet Take 2  tablets by mouth today, then 1 tablet daily for 4 additional days. 6 tablet 0   benzonatate (TESSALON) 200 MG capsule Take 1 capsule (200 mg total) by mouth 3 (three) times daily as needed for cough. 15 capsule 0   fluticasone (FLONASE) 50 MCG/ACT nasal spray Place 1-2 sprays into both nostrils daily as needed for allergies or rhinitis.     labetalol (NORMODYNE) 200 MG tablet Take 1 tablet (200 mg total) by mouth 2 (two) times daily. For blood pressure. 180 tablet 3   predniSONE (DELTASONE) 20 MG tablet Take 2 tablets by mouth once daily for 5 days. 10 tablet 0   Semaglutide,0.25 or 0.5MG /DOS, (OZEMPIC, 0.25 OR 0.5 MG/DOSE,) 2 MG/3ML SOPN Inject 0.25 mg into the skin once weekly x 4 weeks, then increase to 0.5 mg once weekly thereafter.  9 mL 0   sertraline (ZOLOFT) 100 MG tablet Take 100 mg by mouth daily.     traZODone (DESYREL) 100 MG tablet Take 100 mg by mouth at bedtime.     No current facility-administered medications on file prior to visit.    BP 134/88   Pulse 77   Temp (!) 96.7 F (35.9 C) (Oral)   Ht 5' 5.25" (1.657 m)   Wt 201 lb (91.2 kg)   SpO2 97%   BMI 33.19 kg/m  Objective:   Physical Exam Cardiovascular:     Rate and Rhythm: Normal rate and regular rhythm.  Pulmonary:     Effort: Pulmonary effort is normal.     Breath sounds: Normal breath sounds.  Musculoskeletal:     Cervical back: Neck supple.  Skin:    General: Skin is warm and dry.           Assessment & Plan:   Problem List Items Addressed This Visit       Other   Class 1 obesity due to excess calories with body mass index (BMI) of 33.0 to 33.9 in adult    Improving with weight loss of 8 pounds since initiation.   Continue with Ozempic 0.5 mg weekly for another 2 weeks, then increase to 1 mg weekly thereafter.  Continue to work on increasing water consumption. Start exercising.   Follow up in 3 months.          Doreene Nest, NP

## 2022-04-05 NOTE — Assessment & Plan Note (Signed)
Improving with weight loss of 8 pounds since initiation.   Continue with Ozempic 0.5 mg weekly for another 2 weeks, then increase to 1 mg weekly thereafter.  Continue to work on increasing water consumption. Start exercising.   Follow up in 3 months.

## 2022-04-05 NOTE — Patient Instructions (Signed)
Continue Ozempic 0.5 mg weekly.  Message me once you use your last pen and I will increase your dose.  Start exercising. You should be getting 150 minutes of moderate intensity exercise weekly.  Please schedule a follow up visit for 3 months.  It was a pleasure to see you today!

## 2022-04-14 DIAGNOSIS — E6609 Other obesity due to excess calories: Secondary | ICD-10-CM

## 2022-04-14 MED ORDER — SEMAGLUTIDE (1 MG/DOSE) 4 MG/3ML ~~LOC~~ SOPN: 1.0000 mg | PEN_INJECTOR | SUBCUTANEOUS | 0 refills | Status: DC

## 2022-04-17 ENCOUNTER — Telehealth: Payer: Self-pay

## 2022-04-17 DIAGNOSIS — E6609 Other obesity due to excess calories: Secondary | ICD-10-CM

## 2022-04-17 NOTE — Telephone Encounter (Signed)
Prior auth for [employer] 1MG /0.5ML auto-injectors has been approved. [place] (Key: [id number]) Rx #: [place]  [facility]  received a request from your provider for coverage of Wegovy (semaglutide injection). As long as you remain covered by the [facility] and there are no changes to your plan benefits, this request is approved for the following time period: 04/17/2022 - 11/16/2022  Approval letter sent to scanning.  Notified patient via mychart.

## 2022-04-17 NOTE — Telephone Encounter (Signed)
Prior Berkley Harvey has been started for [facility] 1MG /0.5ML auto-injectors. Madan (Key: [id number]) Rx #: [place] Waiting for determination.

## 2022-04-18 ENCOUNTER — Telehealth: Payer: Self-pay

## 2022-04-18 NOTE — Telephone Encounter (Signed)
Prior auth for Ozempic (1 MG/DOSE) 4MG /3ML pen-injectors has been denied.  [facility] received a request from your provider for coverage of Ozempic (semaglutide). The request was denied because:  Your plan only covers this drug when it is used for certain health conditions. Covered use is for type 2 diabetes mellitus. Your plan does not cover the drug for your health condition that your doctor told you have. We reviewed the information we had. Your request has been denied.  Left vmail for patient notifying her this was denied and to let us know what pharmacy she would like for [place] to send [facility] Rx to in order to have it filled.  Sent denial letter to scanning.

## 2022-04-18 NOTE — Telephone Encounter (Signed)
Prior auth started for Ozempic (1 MG/DOSE) 4MG /3ML pen-injectors. [place] (Key: [place]) Rx #: [id number] Waiting for determination.  I sent a note to patient on mychart.  I received a PA for [facility], which was approved, then later received a PA for Ozempic.  I spoke to [facility], who stated she wrote Rx for Ozempic, so the pharmacy or the insurance company must have changed the Rx to [facility].

## 2022-04-18 NOTE — Telephone Encounter (Signed)
Left voicemail for patient notifying her that she will need to check pharmacies to see who currently has Wegovy in stock and notify us so we can send the Rx to that pharmacy.  The Prior auth for Ozempic was denied due to diagnosis.

## 2022-04-20 NOTE — Telephone Encounter (Signed)
Noted, see MyChart messages 

## 2022-04-25 MED ORDER — SAXENDA 18 MG/3ML ~~LOC~~ SOPN
PEN_INJECTOR | SUBCUTANEOUS | 0 refills | Status: DC
Start: 2022-04-25 — End: 2022-04-27

## 2022-04-25 NOTE — Addendum Note (Signed)
Addended by: Doreene Nest on: 04/25/2022 05:37 PM   Modules accepted: Orders

## 2022-04-26 DIAGNOSIS — Z6833 Body mass index (BMI) 33.0-33.9, adult: Secondary | ICD-10-CM

## 2022-04-27 ENCOUNTER — Telehealth: Payer: Self-pay

## 2022-04-27 MED ORDER — WEGOVY 1 MG/0.5ML ~~LOC~~ SOAJ: 1.0000 mg | SUBCUTANEOUS | 0 refills | Status: DC

## 2022-04-27 NOTE — Telephone Encounter (Signed)
Prior auth started for Saxenda 18MG /3ML pen-injectors. [place] (Key: [place]) Rx #[id number] Waiting for determination.

## 2022-04-27 NOTE — Telephone Encounter (Signed)
Patient requested Rebecca Orozco, will discontinue Saxenda.

## 2022-04-27 NOTE — Telephone Encounter (Signed)
Prior auth for Saxenda 18MG /3ML pen-injectors has been approved. [place] (Key: [place]) Rx #[id number] [facility]  received a request from your provider for coverage of Saxenda (liraglutide). As long as you remain covered by the [facility] and there are no changes to your plan benefits, this request is approved for the following time period: 04/27/2022 - 08/27/2022  Patient notified via mychart.  Approval letter sent to scanning.

## 2022-05-05 ENCOUNTER — Ambulatory Visit: Payer: BC Managed Care – PPO | Admitting: Podiatry

## 2022-05-05 ENCOUNTER — Ambulatory Visit (INDEPENDENT_AMBULATORY_CARE_PROVIDER_SITE_OTHER): Payer: BC Managed Care – PPO

## 2022-05-05 DIAGNOSIS — T1490XA Injury, unspecified, initial encounter: Secondary | ICD-10-CM

## 2022-05-05 DIAGNOSIS — S9031XA Contusion of right foot, initial encounter: Secondary | ICD-10-CM | POA: Diagnosis not present

## 2022-05-06 NOTE — Progress Notes (Signed)
   Chief Complaint  Patient presents with   Foot Pain     foot injury, possible fracture, painful, patient dropped furniture on the right foot 3 weeks ago.    HPI: 41 y.o. female presenting today for evaluation of an injury that was sustained about 3 weeks ago when the patient dropped a piece of furniture on her right foot.  She noticed pain and tenderness.  She has been able to bear weight.  She also noticed a knot developed to the dorsum of the foot.  She presents to have it evaluated.  Past Medical History:  Diagnosis Date   Anxiety and depression    Cervical dysplasia    Chronic hypertension affecting pregnancy 05/18/2020   Flu    flu meningitis as infant   History of meningitis    HTN (hypertension)    Migraines    Sinus pressure 08/10/2021   Vaginal Pap smear, abnormal     Past Surgical History:  Procedure Laterality Date   TONSILLECTOMY     VARICOSE VEIN SURGERY Right     Allergies  Allergen Reactions   Amoxicillin-Pot Clavulanate Nausea And Vomiting   Latex Other (See Comments)   Propoxyphene Nausea And Vomiting     Physical Exam: General: The patient is alert and oriented x3 in no acute distress.  Dermatology: Skin is warm, dry and supple bilateral lower extremities. Negative for open lesions or macerations.  Vascular: Palpable pedal pulses bilaterally. Capillary refill within normal limits.  Negative for any significant edema or erythema  Neurological: Light touch and protective threshold grossly intact  Musculoskeletal Exam: No pedal deformities noted.  There is a dorsal lump to the midfoot right foot at the area of the injury.  Associated tenderness to palpation as well.  Radiographic Exam:  Normal osseous mineralization. Joint spaces preserved. No fracture/dislocation/boney destruction.  Negative for any identifiable acute fracture  Assessment: 1.  Soft tissue contusion right dorsum foot   Plan of Care:  1. Patient evaluated. X-Rays reviewed.  2.   Recommend conservative treatment including rest ice elevation and compression 3.  Patient declined a cam boot 4.  Explained to the patient that this should resolve over time.  If there is no significant improvement over the next 4 weeks she will likely need CT scan of the foot.  She will contact our office for CT and follow-up in the office to review the results if needed 5.  Return to clinic as needed      Felecia Shelling, DPM [facility]  Dr. Felecia Shelling, DPM    [address] [place], [place] [address]                Office [phone]  Fax [phone]

## 2022-07-04 ENCOUNTER — Ambulatory Visit: Payer: BC Managed Care – PPO | Admitting: Primary Care

## 2022-07-05 ENCOUNTER — Encounter: Payer: Self-pay | Admitting: Primary Care

## 2022-07-05 ENCOUNTER — Ambulatory Visit: Payer: BC Managed Care – PPO | Admitting: Primary Care

## 2022-07-05 VITALS — BP 138/88 | HR 75 | Temp 97.1°F | Ht 62.5 in | Wt 207.0 lb

## 2022-07-05 DIAGNOSIS — Z6837 Body mass index (BMI) 37.0-37.9, adult: Secondary | ICD-10-CM

## 2022-07-05 DIAGNOSIS — R051 Acute cough: Secondary | ICD-10-CM | POA: Insufficient documentation

## 2022-07-05 MED ORDER — AZITHROMYCIN 250 MG PO TABS: ORAL_TABLET | ORAL | 0 refills | Status: DC

## 2022-07-05 NOTE — Progress Notes (Signed)
Subjective:    Patient ID: Rebecca Orozco, female    DOB: [birth year], 41 y.o.   MRN: [id number]  HPI  Rebecca Orozco is a very pleasant 41 y.o. female with a history of hypertension, class 1 obesity who presents today for follow up of obesity and to discuss nasal congestion.  1) Class 1 Obesity: Currently prescribed Wegovy 1 mg weekly for which was initiated at 0.25 mg weekly about three months ago. Wegovy 1 mg is on national backorder so we initiated Saxenda.   She began [place] in late August, completed 1 month supply only as [place] became on Acupuncturist. She's frustrated as she felt like she was making progress on Wegovy, has lost inches to her waistline.   Diet currently consists of:  Breakfast: protein shake, skips Lunch: Home made wrap Dinner: Skips, crock pot meals, home made meals Snacks: None Desserts: None Beverages: Coke Zero, water  Exercise: Infrequently   2) Nasal Congestion: Also with post nasal drip, ear fullness, cough, fatigue. She denies pressure, fevers. Her daughter has had similar symptoms.  She's tried Flonase, Robitussin, Mucinex. Symptom onset about one month ago. She's not feeling any better.    Wt Readings from Last 3 Encounters:  07/05/22 207 lb (93.9 kg)  04/05/22 201 lb (91.2 kg)  03/13/22 202 lb (91.6 kg)      Review of Systems  Constitutional:  Positive for fatigue.  HENT:  Positive for congestion, postnasal drip and sore throat.   Respiratory:  Positive for cough. Negative for shortness of breath and wheezing.          Past Medical History:  Diagnosis Date   Anxiety and depression    Cervical dysplasia    Chronic hypertension affecting pregnancy 05/18/2020   Flu    flu meningitis as infant   History of meningitis    HTN (hypertension)    Migraines    Sinus pressure 08/10/2021   Vaginal Pap smear, abnormal     Social History   Socioeconomic History   Marital status: Married    Spouse name: Not on file   Number of  children: Not on file   Years of education: Not on file   Highest education level: Not on file  Occupational History   Not on file  Tobacco Use   Smoking status: Never   Smokeless tobacco: Never   Tobacco comments:    tobacco use - no  Vaping Use   Vaping Use: Never used  Substance and Sexual Activity   Alcohol use: Not Currently    Alcohol/week: 0.0 standard drinks of alcohol   Drug use: No   Sexual activity: Not Currently  Other Topics Concern   Not on file  Social History Narrative   Full time teacher at [employer]; gets regular exercise.    Social Determinants of Health   Financial Resource Strain: Not on file  Food Insecurity: Not on file  Transportation Needs: Not on file  Physical Activity: Not on file  Stress: Not on file  Social Connections: Not on file  Intimate Partner Violence: Not on file    Past Surgical History:  Procedure Laterality Date   TONSILLECTOMY     VARICOSE VEIN SURGERY Right     Family History  Problem Relation Age of Onset   Colon cancer Mother 44   Hypertension Mother    Breast cancer Paternal Aunt        late 15s    Allergies  Allergen Reactions   Amoxicillin-Pot Clavulanate  Nausea And Vomiting   Latex Other (See Comments)   Propoxyphene Nausea And Vomiting    Current Outpatient Medications on File Prior to Visit  Medication Sig Dispense Refill   fluticasone (FLONASE) 50 MCG/ACT nasal spray Place 1-2 sprays into both nostrils daily as needed for allergies or rhinitis.     labetalol (NORMODYNE) 200 MG tablet Take 1 tablet (200 mg total) by mouth 2 (two) times daily. For blood pressure. 180 tablet 3   sertraline (ZOLOFT) 100 MG tablet Take 100 mg by mouth daily.     traZODone (DESYREL) 100 MG tablet Take 100 mg by mouth at bedtime.     predniSONE (DELTASONE) 20 MG tablet Take 2 tablets by mouth once daily for 5 days. (Patient not taking: Reported on 07/05/2022) 10 tablet 0   Semaglutide-Weight Management (WEGOVY) 1 MG/0.5ML  SOAJ Inject 1 mg into the skin once a week. (Patient not taking: Reported on 07/05/2022) 6 mL 0   No current facility-administered medications on file prior to visit.    BP 138/88   Pulse 75   Temp (!) 97.1 F (36.2 C) (Temporal)   Ht 5' 2.5" (1.588 m)   Wt 207 lb (93.9 kg)   SpO2 99%   BMI 37.26 kg/m  Objective:   Physical Exam HENT:     Right Ear: Tympanic membrane and ear canal normal.     Left Ear: Tympanic membrane and ear canal normal.     Nose:     Right Sinus: No maxillary sinus tenderness or frontal sinus tenderness.     Left Sinus: No maxillary sinus tenderness or frontal sinus tenderness.     Mouth/Throat:     Pharynx: No posterior oropharyngeal erythema.  Eyes:     Conjunctiva/sclera: Conjunctivae normal.  Cardiovascular:     Rate and Rhythm: Normal rate and regular rhythm.  Pulmonary:     Effort: Pulmonary effort is normal.     Breath sounds: Normal breath sounds. No wheezing or rales.  Musculoskeletal:     Cervical back: Neck supple.  Lymphadenopathy:     Cervical: Cervical adenopathy present.  Skin:    General: Skin is warm and dry.           Assessment & Plan:   Problem List Items Addressed This Visit       Other   Class 2 obesity due to excess calories with body mass index (BMI) of 37.0 to 37.9 in adult    Unfortunately, Wegovy and Saxenda are on national backorder at the doses needed.  We discussed to continue to work on her diet. Increase physical activity when able.  We also discussed that she contact [facility] [place] for further evaluation.       Acute cough - Primary    Given duration of symptoms, coupled with presentation today, will treat.  Start Azithromycin antibiotics for infection. Take 2 tablets by mouth today, then 1 tablet daily for 4 additional days. PCN allergy.  Continue Flonase PRN.      Relevant Medications   azithromycin (ZITHROMAX) 250 MG tablet       Pleas Koch, NP

## 2022-07-05 NOTE — Assessment & Plan Note (Signed)
Unfortunately, Mancel Parsons and Saxenda are on national backorder at the doses needed.  We discussed to continue to work on her diet. Increase physical activity when able.  We also discussed that she contact [facility] [place] for further evaluation.

## 2022-07-05 NOTE — Assessment & Plan Note (Signed)
Given duration of symptoms, coupled with presentation today, will treat.  Start Azithromycin antibiotics for infection. Take 2 tablets by mouth today, then 1 tablet daily for 4 additional days. PCN allergy.  Continue Flonase PRN.

## 2022-07-14 ENCOUNTER — Other Ambulatory Visit: Payer: Self-pay | Admitting: Primary Care

## 2022-07-14 DIAGNOSIS — Z6833 Body mass index (BMI) 33.0-33.9, adult: Secondary | ICD-10-CM

## 2022-07-14 MED ORDER — PEN NEEDLES 31G X 6 MM MISC: 0 refills | Status: DC

## 2022-07-14 NOTE — Telephone Encounter (Signed)
Noted. Refill(s) sent to pharmacy.  

## 2022-07-14 NOTE — Telephone Encounter (Signed)
[facility] pharmacy called requesting if a prescription for 10 needles can be sent in along with the other requested prescription?

## 2022-07-18 DIAGNOSIS — R051 Acute cough: Secondary | ICD-10-CM

## 2022-07-18 MED ORDER — PREDNISONE 20 MG PO TABS: ORAL_TABLET | ORAL | 0 refills | Status: DC

## 2022-08-30 ENCOUNTER — Other Ambulatory Visit (HOSPITAL_COMMUNITY): Payer: Self-pay

## 2022-08-30 ENCOUNTER — Ambulatory Visit: Payer: BC Managed Care – PPO | Admitting: Primary Care

## 2022-08-30 ENCOUNTER — Encounter: Payer: Self-pay | Admitting: Primary Care

## 2022-08-30 VITALS — BP 120/82 | HR 90 | Temp 97.5°F | Ht 62.5 in | Wt 208.0 lb

## 2022-08-30 DIAGNOSIS — R519 Headache, unspecified: Secondary | ICD-10-CM | POA: Diagnosis not present

## 2022-08-30 DIAGNOSIS — G43009 Migraine without aura, not intractable, without status migrainosus: Secondary | ICD-10-CM

## 2022-08-30 DIAGNOSIS — G43909 Migraine, unspecified, not intractable, without status migrainosus: Secondary | ICD-10-CM | POA: Insufficient documentation

## 2022-08-30 DIAGNOSIS — R051 Acute cough: Secondary | ICD-10-CM | POA: Diagnosis not present

## 2022-08-30 LAB — POC COVID19 BINAXNOW: SARS Coronavirus 2 Ag: NEGATIVE

## 2022-08-30 MED ORDER — PREDNISONE 20 MG PO TABS: ORAL_TABLET | ORAL | 0 refills | Status: DC

## 2022-08-30 MED ORDER — AMOXICILLIN 875 MG PO TABS: 875.0000 mg | ORAL_TABLET | Freq: Two times a day (BID) | ORAL | 0 refills | Status: AC

## 2022-08-30 MED ORDER — TOPIRAMATE 50 MG PO TABS: 50.0000 mg | ORAL_TABLET | Freq: Every day | ORAL | 0 refills | Status: DC

## 2022-08-30 MED ORDER — RIZATRIPTAN BENZOATE 5 MG PO TBDP: ORAL_TABLET | ORAL | 0 refills | Status: AC

## 2022-08-30 NOTE — Progress Notes (Signed)
Subjective:    Patient ID: Rebecca Orozco, female    DOB: 1981/09/10, 41 y.o.   MRN: [id number]  Cough Associated symptoms include headaches.    Rebecca Orozco is a very pleasant 41 y.o. female with a history of hypertension, insomnia, palpitations who presents today to discuss cough.  1) Acute Cough: Symptom onset about 2 weeks ago with sore throat. She then developed dry cough, ear fullness, headaches. Over the last 1 week she developed chest congestion and diarrhea.   Treated for URI symptoms in October 2023 with Zpack, but she never felt resolved from that visit.  She did feel immediately better after a few days of prednisone.  Her daughter was diagnosed with RSV two weeks ago, her symptoms have improved.  She's taken Robitussin, Flonase, Tylenol without improvement.  Overall she feels about the same, no improvement.  2) Chronic Migraines: Chronic since teenage years. Typically located to the left frontal lobe with radiation to her temporal, parietal, and occipital lobes. In her 20's she was placed on Topamax for preventative care which helped tremendously. She was able to come off Topamax after a few years and has had no problems since. She was once also managed on abortive treatment which helped.   Over the last 2 months she's experienced daily headaches with weekly migraines with photophobia, phonophobia, and nausea with vomiting.   She's recently taken Tylenol and Advil without improvement.   Review of Systems  Constitutional:  Positive for fatigue.  HENT:  Positive for congestion.   Eyes:  Positive for photophobia.  Respiratory:  Positive for cough.   Neurological:  Positive for headaches.         Past Medical History:  Diagnosis Date   Anxiety and depression    Cervical dysplasia    Chronic hypertension affecting pregnancy 05/18/2020   Flu    flu meningitis as infant   History of meningitis    HTN (hypertension)    Migraines    Sinus pressure 08/10/2021   Vaginal Pap  smear, abnormal     Social History   Socioeconomic History   Marital status: Married    Spouse name: Not on file   Number of children: Not on file   Years of education: Not on file   Highest education level: Not on file  Occupational History   Not on file  Tobacco Use   Smoking status: Never   Smokeless tobacco: Never   Tobacco comments:    tobacco use - no  Vaping Use   Vaping Use: Never used  Substance and Sexual Activity   Alcohol use: Not Currently    Alcohol/week: 0.0 standard drinks of alcohol   Drug use: No   Sexual activity: Not Currently  Other Topics Concern   Not on file  Social History Narrative   Full time teacher at [employer]; gets regular exercise.    Social Determinants of Health   Financial Resource Strain: Not on file  Food Insecurity: Not on file  Transportation Needs: Not on file  Physical Activity: Not on file  Stress: Not on file  Social Connections: Not on file  Intimate Partner Violence: Not on file    Past Surgical History:  Procedure Laterality Date   TONSILLECTOMY     VARICOSE VEIN SURGERY Right     Family History  Problem Relation Age of Onset   Colon cancer Mother 61   Hypertension Mother    Breast cancer Paternal Aunt        late  50s    Allergies  Allergen Reactions   Amoxicillin-Pot Clavulanate Nausea And Vomiting   Latex Other (See Comments)   Propoxyphene Nausea And Vomiting    Current Outpatient Medications on File Prior to Visit  Medication Sig Dispense Refill   fluticasone (FLONASE) 50 MCG/ACT nasal spray Place 1-2 sprays into both nostrils daily as needed for allergies or rhinitis.     Insulin Pen Needle (PEN NEEDLES) 31G X 6 MM MISC Use nightly with insulin. 100 each 0   labetalol (NORMODYNE) 200 MG tablet Take 1 tablet (200 mg total) by mouth 2 (two) times daily. For blood pressure. 180 tablet 3   Liraglutide -Weight Management (SAXENDA) 18 MG/3ML SOPN INJECT 0.6MG  SQ DAILY FOR 1 WEEK, THEN INCREASE TO  1.2MG  DAILY THEREAFTER 15 mL 0   sertraline (ZOLOFT) 100 MG tablet Take 100 mg by mouth daily.     traZODone (DESYREL) 100 MG tablet Take 100 mg by mouth at bedtime.     No current facility-administered medications on file prior to visit.    BP 120/82   Pulse 90   Temp (!) 97.5 F (36.4 C) (Temporal)   Ht 5' 2.5" (1.588 m)   Wt 208 lb (94.3 kg)   SpO2 98%   BMI 37.44 kg/m  Objective:   Physical Exam Constitutional:      Appearance: She is ill-appearing.  HENT:     Right Ear: Tympanic membrane and ear canal normal.     Left Ear: Tympanic membrane and ear canal normal.     Nose:     Right Sinus: No maxillary sinus tenderness or frontal sinus tenderness.     Left Sinus: No maxillary sinus tenderness or frontal sinus tenderness.     Mouth/Throat:     Pharynx: No posterior oropharyngeal erythema.  Eyes:     Conjunctiva/sclera: Conjunctivae normal.  Cardiovascular:     Rate and Rhythm: Normal rate and regular rhythm.  Pulmonary:     Effort: Pulmonary effort is normal.     Breath sounds: Normal breath sounds. No wheezing or rales.  Musculoskeletal:     Cervical back: Neck supple.  Lymphadenopathy:     Cervical: No cervical adenopathy.  Skin:    General: Skin is warm and dry.  Psychiatric:        Mood and Affect: Mood normal.           Assessment & Plan:   Problem List Items Addressed This Visit       Cardiovascular and Mediastinum   Migraines    Treat with Maxalt ODT 5 mg tablets as needed. Will also treat frequent headaches with Topamax 50 mg at bedtime.  She will update.      Relevant Medications   topiramate (TOPAMAX) 50 MG tablet   rizatriptan (MAXALT-MLT) 5 MG disintegrating tablet     Other   Acute cough - Primary    Unclear etiology, could still be viral.  Given duration of symptoms we will treat with antibiotics.  Start Amoxil 875 twice daily x 7 days. Start prednisone 20 mg tablets. Take 2 tablets by mouth once daily in the morning for 5  days.  Follow up PRN.      Relevant Medications   amoxicillin (AMOXIL) 875 MG tablet   predniSONE (DELTASONE) 20 MG tablet   Other Relevant Orders   POC COVID-19   Frequent headaches    Uncontrolled.  Start Topamax 50 mg at bedtime she did well on this historically. Start Maxalt 5 mg disintegrating  tablet as needed for migraines.  She will update.      Relevant Medications   topiramate (TOPAMAX) 50 MG tablet   rizatriptan (MAXALT-MLT) 5 MG disintegrating tablet       Doreene Nest, NP

## 2022-08-30 NOTE — Assessment & Plan Note (Signed)
Uncontrolled.  Start Topamax 50 mg at bedtime she did well on this historically. Start Maxalt 5 mg disintegrating tablet as needed for migraines.  She will update.

## 2022-08-30 NOTE — Assessment & Plan Note (Signed)
Unclear etiology, could still be viral.  Given duration of symptoms we will treat with antibiotics.  Start Amoxil 875 twice daily x 7 days. Start prednisone 20 mg tablets. Take 2 tablets by mouth once daily in the morning for 5 days.  Follow up PRN.

## 2022-08-30 NOTE — Assessment & Plan Note (Signed)
Treat with Maxalt ODT 5 mg tablets as needed. Will also treat frequent headaches with Topamax 50 mg at bedtime.  She will update.

## 2022-08-30 NOTE — Patient Instructions (Signed)
Start topiramate (Topamax) 50 mg tablets for headache prevention.  Take 1 tablet by mouth at bedtime.  Start rizatriptan 5 mg, take this as needed for migraine abortion.  Start amoxicilln 875 mg tablets, take 1 tablet by mouth twice daily x 7 days.  Start prednisone 20 mg tablets. Take 2 tablets by mouth once daily in the morning for 5 days.  It was a pleasure to see you today!

## 2022-09-05 ENCOUNTER — Other Ambulatory Visit (HOSPITAL_COMMUNITY): Payer: Self-pay

## 2022-09-05 ENCOUNTER — Telehealth: Payer: Self-pay

## 2022-09-05 NOTE — Telephone Encounter (Signed)
Pharmacy Patient Advocate Encounter   Received notification from [facility] that prior authorization for Saxenda 18mg /29ml is required/requested.    PA submitted on 09/05/22 to (ins) [employer]  via CoverMyMeds Key [id number] Status is pending

## 2022-09-06 NOTE — Telephone Encounter (Signed)
Pharmacy Patient Advocate Encounter  Received notification from [facility] that the request for prior authorization for Saxenda 18mg /74ml has been denied due to      Please Advise

## 2022-09-06 NOTE — Telephone Encounter (Signed)
Unable to reach patient. Left voicemail to return call to our office.   

## 2022-09-06 NOTE — Telephone Encounter (Signed)
Please call patient:  [name]

## 2022-09-07 NOTE — Telephone Encounter (Addendum)
Noted  

## 2022-09-07 NOTE — Telephone Encounter (Signed)
Notified patient of the denial of the PA. She stated she started the Saxenda and did a 3 month supply. Now, pharmacy is unable to get prescription.

## 2022-11-27 ENCOUNTER — Ambulatory Visit: Payer: BC Managed Care – PPO | Admitting: Internal Medicine

## 2022-11-27 ENCOUNTER — Encounter: Payer: Self-pay | Admitting: Internal Medicine

## 2022-11-27 VITALS — BP 104/60 | HR 88 | Temp 97.9°F | Ht 62.5 in | Wt 215.0 lb

## 2022-11-27 DIAGNOSIS — J0140 Acute pansinusitis, unspecified: Secondary | ICD-10-CM

## 2022-11-27 DIAGNOSIS — J019 Acute sinusitis, unspecified: Secondary | ICD-10-CM | POA: Insufficient documentation

## 2022-11-27 DIAGNOSIS — J0100 Acute maxillary sinusitis, unspecified: Secondary | ICD-10-CM

## 2022-11-27 HISTORY — DX: Acute maxillary sinusitis, unspecified: J01.00

## 2022-11-27 MED ORDER — AMOXICILLIN 500 MG PO TABS: 1000.0000 mg | ORAL_TABLET | Freq: Two times a day (BID) | ORAL | 0 refills | Status: AC

## 2022-11-27 NOTE — Progress Notes (Signed)
Subjective:    Patient ID: Rebecca Orozco, female    DOB: [birth year], 42 y.o.   MRN: [id number]  HPI Here due to persistent respiratory/sinus symptoms  Has been congested with cough and sinus pressure for a few weeks Last week "it felt like a balloon in my head" Hearing off and ears feel full Stuffed nose No fever Slight sputum also Does feel post nasal drip Some DOE---like going up stairs  Takes zyrtec and flonase--no general spring allergy problems Used neti pot every morning--but not much coming out  Current Outpatient Medications on File Prior to Visit  Medication Sig Dispense Refill   cetirizine (ZYRTEC) 10 MG tablet Take 10 mg by mouth daily.     fluticasone (FLONASE) 50 MCG/ACT nasal spray Place 1-2 sprays into both nostrils daily as needed for allergies or rhinitis.     labetalol (NORMODYNE) 200 MG tablet Take 1 tablet (200 mg total) by mouth 2 (two) times daily. For blood pressure. 180 tablet 3   rizatriptan (MAXALT-MLT) 5 MG disintegrating tablet Take 1 tablet by mouth at migraine onset. May repeat in 2 hours if needed 10 tablet 0   sertraline (ZOLOFT) 100 MG tablet Take 100 mg by mouth daily.     topiramate (TOPAMAX) 50 MG tablet Take 1 tablet (50 mg total) by mouth at bedtime. 90 tablet 0   traZODone (DESYREL) 100 MG tablet Take 100 mg by mouth at bedtime.     No current facility-administered medications on file prior to visit.    Allergies  Allergen Reactions   Amoxicillin-Pot Clavulanate Nausea And Vomiting   Latex Other (See Comments)   Propoxyphene Nausea And Vomiting    Past Medical History:  Diagnosis Date   Anxiety and depression    Cervical dysplasia    Chronic hypertension affecting pregnancy 05/18/2020   Flu    flu meningitis as infant   History of meningitis    HTN (hypertension)    Migraines    Sinus pressure 08/10/2021   Vaginal Pap smear, abnormal     Past Surgical History:  Procedure Laterality Date   TONSILLECTOMY     VARICOSE VEIN  SURGERY Right     Family History  Problem Relation Age of Onset   Colon cancer Mother 10   Hypertension Mother    Breast cancer Paternal Aunt        late 70s    Social History   Socioeconomic History   Marital status: Married    Spouse name: Not on file   Number of children: Not on file   Years of education: Not on file   Highest education level: Not on file  Occupational History   Not on file  Tobacco Use   Smoking status: Never   Smokeless tobacco: Never   Tobacco comments:    tobacco use - no  Vaping Use   Vaping Use: Never used  Substance and Sexual Activity   Alcohol use: Not Currently    Alcohol/week: 0.0 standard drinks of alcohol   Drug use: No   Sexual activity: Not Currently  Other Topics Concern   Not on file  Social History Narrative   Full time teacher at [employer]; gets regular exercise.    Social Determinants of Health   Financial Resource Strain: Not on file  Food Insecurity: Not on file  Transportation Needs: Not on file  Physical Activity: Not on file  Stress: Not on file  Social Connections: Not on file  Intimate Partner Violence: Not on  file   Review of Systems No N/V Appetite off--mostly just eating once a day     Objective:   Physical Exam Constitutional:      Appearance: Normal appearance.  HENT:     Head:     Comments: Mild frontal and maxillary tenderness    Right Ear: Tympanic membrane and ear canal normal.     Left Ear: Tympanic membrane and ear canal normal.     Nose: Congestion present.     Mouth/Throat:     Pharynx: No oropharyngeal exudate or posterior oropharyngeal erythema.  Pulmonary:     Effort: Pulmonary effort is normal.     Breath sounds: Normal breath sounds. No wheezing or rales.  Musculoskeletal:     Cervical back: Neck supple.  Lymphadenopathy:     Cervical: No cervical adenopathy.  Neurological:     Mental Status: She is alert.            Assessment & Plan:

## 2022-11-27 NOTE — Assessment & Plan Note (Signed)
Will continue the flonase Analgesics Will treat with amoxil 1000 bid (only intolerant to clavulonate) OTC cough meds

## 2022-11-28 ENCOUNTER — Ambulatory Visit: Payer: BC Managed Care – PPO | Admitting: Family Medicine

## 2022-11-28 ENCOUNTER — Encounter: Payer: Self-pay | Admitting: Internal Medicine

## 2022-11-28 ENCOUNTER — Encounter: Payer: Self-pay | Admitting: Family Medicine

## 2022-11-28 VITALS — BP 118/82 | HR 111 | Temp 97.7°F | Ht 62.5 in | Wt 210.5 lb

## 2022-11-28 DIAGNOSIS — N3000 Acute cystitis without hematuria: Secondary | ICD-10-CM | POA: Diagnosis not present

## 2022-11-28 DIAGNOSIS — R300 Dysuria: Secondary | ICD-10-CM | POA: Diagnosis not present

## 2022-11-28 LAB — POCT UA - MICROSCOPIC ONLY

## 2022-11-28 LAB — POC URINALSYSI DIPSTICK (AUTOMATED)
Bilirubin, UA: 1
Blood, UA: 200 — AB
Glucose, UA: NEGATIVE
Ketones, UA: NEGATIVE
Nitrite, UA: NEGATIVE
Protein, UA: POSITIVE — AB
Spec Grav, UA: >=1.03 — AB (ref 1.010–1.025)
Urobilinogen, UA: 0.2 E.U./dL
pH, UA: 6 (ref 5.0–8.0)

## 2022-11-28 MED ORDER — NITROFURANTOIN MONOHYD MACRO 100 MG PO CAPS: 100.0000 mg | ORAL_CAPSULE | Freq: Two times a day (BID) | ORAL | 0 refills | Status: DC

## 2022-11-28 NOTE — Patient Instructions (Signed)
Drink lots of fluids -water   Take the macrobid as directed  We will contact you with urine culture result when it returns  If symptoms suddenly worsen let us know   Update if not starting to improve in a week or if worsening

## 2022-11-28 NOTE — Assessment & Plan Note (Signed)
Freq/urgency and dysuria day 2  Already on amox for sinusitis Px macrobid bid for 5 d Culture pending-wil call and change tx if needed  Inst to drink lots of water  ER precautions noted   Update if not starting to improve in a week or if worsening

## 2022-11-28 NOTE — Progress Notes (Signed)
Subjective:    Patient ID: Rebecca Orozco, female    DOB: 27-Jul-1981, 42 y.o.   MRN: [id number]  HPI 42 yo pf of NP Clark presents with urinary symptoms   Wt Readings from Last 3 Encounters:  11/28/22 210 lb 8 oz (95.5 kg)  11/27/22 215 lb (97.5 kg)  08/30/22 208 lb (94.3 kg)   37.89 kg/m  Vitals:   11/28/22 1458  BP: 118/82  Pulse: (!) 111  Temp: 97.7 F (36.5 C)  SpO2: 98%    Was seen yesterday by Dr Silvio Pate for sinusitis Treated with amoxil 1000 mg bid    (she is intol go augmentin)  Then woke up today with urinary symptoms   Low abd pain/cramping  Frequency /urgency  Burns to urinate No blood   No fever  Had a wave of nausea last night   Some low back pain  No flank pain    Has not had uti in very long time   Drinks 40-80 oz of water per day  Some cranberry daily also   Ua is concentrated today    Has IUD for contraception   Results for orders placed or performed in visit on 11/28/22  POCT Urinalysis Dipstick (Automated)  Result Value Ref Range   Color, UA Yellow    Clarity, UA Hazy    Glucose, UA Negative Negative   Bilirubin, UA 1 mg/dL    Ketones, UA Negative    Spec Grav, UA >=1.030 (A) 1.010 - 1.025   Blood, UA 200 Ery/uL (A)    pH, UA 6.0 5.0 - 8.0   Protein, UA Positive (A) Negative   Urobilinogen, UA 0.2 0.2 or 1.0 E.U./dL   Nitrite, UA Negative    Leukocytes, UA Large (3+) (A) Negative  POCT UA - Microscopic Only  Result Value Ref Range   WBC, Ur, HPF, POC many 0 - 5   RBC, Urine, Miroscopic many 0 - 2   Bacteria, U Microscopic many None - Trace   Mucus, UA few    Epithelial cells, urine per micros few    Crystals, Ur, HPF, POC none    Casts, Ur, LPF, POC none    Yeast, UA none        Patient Active Problem List   Diagnosis Date Noted   Acute non-recurrent pansinusitis 11/27/2022   Frequent headaches 08/30/2022   Migraines 08/30/2022   Acute cough 07/05/2022   Class 2 obesity due to excess calories with body mass index  (BMI) of 37.0 to 37.9 in adult 02/08/2022   Preventative health care 11/15/2021   Pre-eclampsia superimposed on chronic hypertension 05/29/2020   Female fertility problem 04/14/2019   Cervical dysplasia 01/05/2015   Insomnia 01/05/2015   PALPITATIONS 12/09/2009   DEPRESSION/ANXIETY 11/23/2009   Essential hypertension 11/23/2009   Past Medical History:  Diagnosis Date   Anxiety and depression    Cervical dysplasia    Chronic hypertension affecting pregnancy 05/18/2020   Flu    flu meningitis as infant   History of meningitis    HTN (hypertension)    Migraines    Sinus pressure 08/10/2021   Vaginal Pap smear, abnormal    Past Surgical History:  Procedure Laterality Date   TONSILLECTOMY     VARICOSE VEIN SURGERY Right    Social History   Tobacco Use   Smoking status: Never   Smokeless tobacco: Never   Tobacco comments:    tobacco use - no  Vaping Use   Vaping Use: Never used  Substance Use Topics   Alcohol use: Not Currently    Alcohol/week: 0.0 standard drinks of alcohol   Drug use: No   Family History  Problem Relation Age of Onset   Colon cancer Mother 86   Hypertension Mother    Breast cancer Paternal Aunt        late 77s   Allergies  Allergen Reactions   Amoxicillin-Pot Clavulanate Nausea And Vomiting   Latex Other (See Comments)   Propoxyphene Nausea And Vomiting   Current Outpatient Medications on File Prior to Visit  Medication Sig Dispense Refill   amoxicillin (AMOXIL) 500 MG tablet Take 2 tablets (1,000 mg total) by mouth 2 (two) times daily for 7 days. 28 tablet 0   cetirizine (ZYRTEC) 10 MG tablet Take 10 mg by mouth daily.     fluticasone (FLONASE) 50 MCG/ACT nasal spray Place 1-2 sprays into both nostrils daily as needed for allergies or rhinitis.     labetalol (NORMODYNE) 200 MG tablet Take 1 tablet (200 mg total) by mouth 2 (two) times daily. For blood pressure. 180 tablet 3   rizatriptan (MAXALT-MLT) 5 MG disintegrating tablet Take 1 tablet by  mouth at migraine onset. May repeat in 2 hours if needed 10 tablet 0   sertraline (ZOLOFT) 100 MG tablet Take 100 mg by mouth daily.     topiramate (TOPAMAX) 50 MG tablet Take 1 tablet (50 mg total) by mouth at bedtime. 90 tablet 0   traZODone (DESYREL) 100 MG tablet Take 100 mg by mouth at bedtime.     No current facility-administered medications on file prior to visit.    Review of Systems  Constitutional:  Negative for activity change, appetite change, fatigue, fever and unexpected weight change.  HENT:  Positive for congestion. Negative for ear pain, rhinorrhea, sinus pressure and sore throat.   Eyes:  Negative for pain, redness and visual disturbance.  Respiratory:  Negative for cough, shortness of breath and wheezing.   Cardiovascular:  Negative for chest pain and palpitations.  Gastrointestinal:  Negative for abdominal pain, blood in stool, constipation and diarrhea.  Endocrine: Negative for polydipsia and polyuria.  Genitourinary:  Positive for dysuria, frequency and urgency. Negative for difficulty urinating, hematuria and menstrual problem.  Musculoskeletal:  Negative for arthralgias, back pain and myalgias.  Skin:  Negative for pallor and rash.  Allergic/Immunologic: Negative for environmental allergies.  Neurological:  Negative for dizziness, syncope and headaches.  Hematological:  Negative for adenopathy. Does not bruise/bleed easily.  Psychiatric/Behavioral:  Negative for decreased concentration and dysphoric mood. The patient is not nervous/anxious.        Objective:   Physical Exam Constitutional:      General: She is not in acute distress.    Appearance: Normal appearance. She is well-developed. She is obese. She is not ill-appearing.  HENT:     Head: Normocephalic and atraumatic.  Eyes:     Conjunctiva/sclera: Conjunctivae normal.     Pupils: Pupils are equal, round, and reactive to light.  Neck:     Thyroid: No thyromegaly.     Vascular: No carotid bruit or  JVD.  Cardiovascular:     Rate and Rhythm: Normal rate and regular rhythm.     Heart sounds: Normal heart sounds.     No gallop.  Pulmonary:     Effort: Pulmonary effort is normal. No respiratory distress.     Breath sounds: Normal breath sounds. No wheezing or rales.  Abdominal:     General: There is no distension  or abdominal bruit.     Palpations: Abdomen is soft.     Tenderness: There is no right CVA tenderness, left CVA tenderness, guarding or rebound.     Comments: No suprapubic tenderness or fullness     Musculoskeletal:     Cervical back: Normal range of motion and neck supple.     Right lower leg: No edema.     Left lower leg: No edema.  Lymphadenopathy:     Cervical: No cervical adenopathy.  Skin:    General: Skin is warm and dry.     Coloration: Skin is not pale.     Findings: No rash.  Neurological:     Mental Status: She is alert.     Coordination: Coordination normal.     Deep Tendon Reflexes: Reflexes are normal and symmetric. Reflexes normal.  Psychiatric:        Mood and Affect: Mood normal.           Assessment & Plan:   Problem List Items Addressed This Visit       Genitourinary   Acute cystitis - Primary    Freq/urgency and dysuria day 2  Already on amox for sinusitis Px macrobid bid for 5 d Culture pending-wil call and change tx if needed  Inst to drink lots of water  ER precautions noted   Update if not starting to improve in a week or if worsening           Relevant Orders   POCT UA - Microscopic Only (Completed)   Urine Culture   Other Visit Diagnoses     Dysuria       Relevant Orders   POCT Urinalysis Dipstick (Automated) (Completed)

## 2022-11-29 LAB — URINE CULTURE
MICRO NUMBER:: 14711730
Result:: NO GROWTH
SPECIMEN QUALITY:: ADEQUATE

## 2022-11-30 ENCOUNTER — Encounter: Payer: Self-pay | Admitting: Family Medicine

## 2022-12-04 ENCOUNTER — Other Ambulatory Visit: Payer: Self-pay

## 2022-12-04 ENCOUNTER — Emergency Department
Admission: EM | Admit: 2022-12-04 | Discharge: 2022-12-04 | Disposition: A | Discharge status: Home / Self Care | Payer: BC Managed Care – PPO | Attending: Emergency Medicine | Admitting: Emergency Medicine

## 2022-12-04 ENCOUNTER — Telehealth: Payer: Self-pay

## 2022-12-04 ENCOUNTER — Telehealth: Payer: Self-pay | Admitting: Urology

## 2022-12-04 ENCOUNTER — Encounter: Payer: Self-pay | Admitting: Emergency Medicine

## 2022-12-04 ENCOUNTER — Emergency Department: Payer: BC Managed Care – PPO

## 2022-12-04 DIAGNOSIS — R1031 Right lower quadrant pain: Secondary | ICD-10-CM | POA: Diagnosis present

## 2022-12-04 DIAGNOSIS — N201 Calculus of ureter: Secondary | ICD-10-CM | POA: Diagnosis not present

## 2022-12-04 DIAGNOSIS — I10 Essential (primary) hypertension: Secondary | ICD-10-CM | POA: Diagnosis not present

## 2022-12-04 DIAGNOSIS — N179 Acute kidney failure, unspecified: Secondary | ICD-10-CM | POA: Diagnosis not present

## 2022-12-04 DIAGNOSIS — N138 Other obstructive and reflux uropathy: Secondary | ICD-10-CM | POA: Insufficient documentation

## 2022-12-04 DIAGNOSIS — R109 Unspecified abdominal pain: Secondary | ICD-10-CM

## 2022-12-04 LAB — COMPREHENSIVE METABOLIC PANEL
ALT: 19 U/L (ref 0–44)
AST: 19 U/L (ref 15–41)
Albumin: 4.1 g/dL (ref 3.5–5.0)
Alkaline Phosphatase: 51 U/L (ref 38–126)
Anion gap: 12 (ref 5–15)
BUN: 21 mg/dL — ABNORMAL HIGH (ref 6–20)
CO2: 21 mmol/L — ABNORMAL LOW (ref 22–32)
Calcium: 8.9 mg/dL (ref 8.9–10.3)
Chloride: 105 mmol/L (ref 98–111)
Creatinine, Ser: 1.3 mg/dL — ABNORMAL HIGH (ref 0.44–1.00)
GFR, Estimated: 53 mL/min — ABNORMAL LOW (ref >60)
Glucose, Bld: 99 mg/dL (ref 70–99)
Potassium: 3.5 mmol/L (ref 3.5–5.1)
Sodium: 138 mmol/L (ref 135–145)
Total Bilirubin: 1.1 mg/dL (ref 0.3–1.2)
Total Protein: 7.3 g/dL (ref 6.5–8.1)

## 2022-12-04 LAB — CBC WITH DIFFERENTIAL/PLATELET
Abs Immature Granulocytes: 0.03 10*3/uL (ref 0.00–0.07)
Basophils Absolute: 0 10*3/uL (ref 0.0–0.1)
Basophils Relative: 0 %
Eosinophils Absolute: 0.1 10*3/uL (ref 0.0–0.5)
Eosinophils Relative: 1 %
HCT: 39.5 % (ref 36.0–46.0)
Hemoglobin: 13.3 g/dL (ref 12.0–15.0)
Immature Granulocytes: 0 %
Lymphocytes Relative: 13 %
Lymphs Abs: 1 10*3/uL (ref 0.7–4.0)
MCH: 30.4 pg (ref 26.0–34.0)
MCHC: 33.7 g/dL (ref 30.0–36.0)
MCV: 90.2 fL (ref 80.0–100.0)
Monocytes Absolute: 0.5 10*3/uL (ref 0.1–1.0)
Monocytes Relative: 7 %
Neutro Abs: 6.4 10*3/uL (ref 1.7–7.7)
Neutrophils Relative %: 79 %
Platelets: 232 10*3/uL (ref 150–400)
RBC: 4.38 MIL/uL (ref 3.87–5.11)
RDW: 11.6 % (ref 11.5–15.5)
WBC: 8 10*3/uL (ref 4.0–10.5)
nRBC: 0 % (ref 0.0–0.2)

## 2022-12-04 LAB — URINALYSIS, ROUTINE W REFLEX MICROSCOPIC
Bilirubin Urine: NEGATIVE
Glucose, UA: NEGATIVE mg/dL
Ketones, ur: NEGATIVE mg/dL
Leukocytes,Ua: NEGATIVE
Nitrite: NEGATIVE
Protein, ur: NEGATIVE mg/dL
Specific Gravity, Urine: 1.023 (ref 1.005–1.030)
pH: 7 (ref 5.0–8.0)

## 2022-12-04 LAB — LIPASE, BLOOD: Lipase: 32 U/L (ref 11–51)

## 2022-12-04 MED ORDER — ONDANSETRON HCL 4 MG/2ML IJ SOLN
4.0000 mg | Freq: Once | INTRAMUSCULAR | Status: AC
  Administered 2022-12-04: 4 mg via INTRAVENOUS
  Filled 2022-12-04: qty 2

## 2022-12-04 MED ORDER — TAMSULOSIN HCL 0.4 MG PO CAPS: 0.4000 mg | ORAL_CAPSULE | Freq: Every day | ORAL | 0 refills | Status: AC

## 2022-12-04 MED ORDER — MORPHINE SULFATE (PF) 4 MG/ML IV SOLN
4.0000 mg | Freq: Once | INTRAVENOUS | Status: AC
  Administered 2022-12-04: 4 mg via INTRAVENOUS
  Filled 2022-12-04: qty 1

## 2022-12-04 MED ORDER — OXYCODONE-ACETAMINOPHEN 5-325 MG PO TABS: 1.0000 | ORAL_TABLET | ORAL | 0 refills | Status: AC | PRN

## 2022-12-04 MED ORDER — KETOROLAC TROMETHAMINE 30 MG/ML IJ SOLN: 15.0000 mg | Freq: Once | INTRAMUSCULAR | Status: DC

## 2022-12-04 MED ORDER — KETOROLAC TROMETHAMINE 15 MG/ML IJ SOLN
INTRAMUSCULAR | Status: AC
  Administered 2022-12-04: 15 mg
  Filled 2022-12-04: qty 1

## 2022-12-04 MED ORDER — IBUPROFEN 600 MG PO TABS: 600.0000 mg | ORAL_TABLET | Freq: Four times a day (QID) | ORAL | 0 refills | Status: DC | PRN

## 2022-12-04 MED ORDER — ONDANSETRON 4 MG PO TBDP: 4.0000 mg | ORAL_TABLET | Freq: Three times a day (TID) | ORAL | 0 refills | Status: DC | PRN

## 2022-12-04 MED ORDER — LACTATED RINGERS IV BOLUS
1000.0000 mL | Freq: Once | INTRAVENOUS | Status: AC
  Administered 2022-12-04: 1000 mL via INTRAVENOUS

## 2022-12-04 NOTE — ED Provider Notes (Signed)
[facility] Provider Note    Event Date/Time   First MD Initiated Contact with Patient 12/04/22 [phone]     (approximate)   History   Chief Complaint Flank Pain   HPI  Rebecca Orozco is a 42 y.o. female with past medical history of hypertension and migraines who presents to the ED complaining of flank pain.  Patient reports that she has been dealing with about 1 week of dysuria and urinary frequency, was initially prescribed Macrobid by her PCP for this, which she completed yesterday.  She has not had any improvement in her urinary symptoms since then, reports dealing with increasing pain in her right flank radiating towards the right side of her groin over the past 24 hours.  She has felt nauseous with a couple episodes of vomiting, but has not had any fevers.  She denies any changes in her bowel movements.  She thinks she could have a kidney stone, but denies any history of this.     Physical Exam   Triage Vital Signs: ED Triage Vitals  Enc Vitals Group     BP --      Pulse --      Resp --      Temp --      Temp src --      SpO2 --      Weight 12/04/22 0519 210 lb 8.6 oz (95.5 kg)     Height 12/04/22 0519 5' 0.25" (1.53 m)     Head Circumference --      Peak Flow --      Pain Score 12/04/22 0518 9     Pain Loc --      Pain Edu? --      Excl. in [place]? --     Most recent vital signs: Vitals:   12/04/22 0600 12/04/22 0630  BP: (!) 142/88 (!) 140/95  Pulse: 69 66  Resp:    Temp:    SpO2: 97% 97%    Constitutional: Alert and oriented. Eyes: Conjunctivae are normal. Head: Atraumatic. Nose: No congestion/rhinnorhea. Mouth/Throat: Mucous membranes are moist.  Cardiovascular: Normal rate, regular rhythm. Grossly normal heart sounds.  2+ radial pulses bilaterally. Respiratory: Normal respiratory effort.  No retractions. Lungs CTAB. Gastrointestinal: Soft and tender to palpation in the right lower quadrant with no rebound or guarding.  No CVA  tenderness bilaterally. No distention. Musculoskeletal: No lower extremity tenderness nor edema.  Neurologic:  Normal speech and language. No gross focal neurologic deficits are appreciated.    ED Results / Procedures / Treatments   Labs (all labs ordered are listed, but only abnormal results are displayed) Labs Reviewed  COMPREHENSIVE METABOLIC PANEL - Abnormal; Notable for the following components:      Result Value   CO2 21 (*)    BUN 21 (*)    Creatinine, Ser 1.30 (*)    GFR, Estimated 53 (*)    All other components within normal limits  URINALYSIS, ROUTINE W REFLEX MICROSCOPIC - Abnormal; Notable for the following components:   Color, Urine YELLOW (*)    APPearance HAZY (*)    Hgb urine dipstick SMALL (*)    Bacteria, UA RARE (*)    All other components within normal limits  CBC WITH DIFFERENTIAL/PLATELET  LIPASE, BLOOD  POC URINE PREG, ED    PROCEDURES:  Critical Care performed: No  Procedures   MEDICATIONS ORDERED IN ED: Medications  morphine (PF) 4 MG/ML injection 4 mg (4 mg Intravenous Given 12/04/22  0554)  ondansetron (ZOFRAN) injection 4 mg (4 mg Intravenous Given 12/04/22 0554)  lactated ringers bolus 1,000 mL (0 mLs Intravenous Stopped 12/04/22 0636)  ketorolac (TORADOL) 15 MG/ML injection (15 mg  Given 12/04/22 0607)     IMPRESSION / MDM / ASSESSMENT AND PLAN / ED COURSE  I reviewed the triage vital signs and the nursing notes.                              42 y.o. female with past medical history of hypertension and migraines who presents to the ED complaining of 1 week of dysuria and frequency, followed by about 24 hours of right flank pain radiating towards her groin associated with nausea and vomiting.  Patient's presentation is most consistent with acute presentation with potential threat to life or bodily function.  Differential diagnosis includes, but is not limited to, cystitis, pyelonephritis, kidney stone, appendicitis, biliary colic,  cholecystitis, pregnancy.  Patient uncomfortable appearing but nontoxic and in no acute distress, vital signs remarkable for hypertension but otherwise reassuring.  Pain primarily reproducible with palpation of the right side of her abdomen, no CVA tenderness noted.  Pregnancy testing and urinalysis are pending, we will further assess with CT renal protocol, treat symptomatically with IV morphine and Zofran.  Labs remarkable for mild AKI but no significant electrolyte abnormality noted and LFTs and lipase are unremarkable.  Labs without significant anemia or leukocytosis, pregnancy testing is negative and urinalysis does not appear concerning for infection.  Patient given IV Toradol for ongoing pain, patient turned over to oncoming divider pending CT results and reassessment.      FINAL CLINICAL IMPRESSION(S) / ED DIAGNOSES   Final diagnoses:  Flank pain     Rx / DC Orders   ED Discharge Orders     None        Note:  This document was prepared using Dragon voice recognition software and may include unintentional dictation errors.   Blake Divine, MD 12/04/22 [phone]

## 2022-12-04 NOTE — Telephone Encounter (Signed)
Sent pt a MyChart message to call office to schedule appt, v/m has not been set up on cell phone#.

## 2022-12-04 NOTE — ED Triage Notes (Signed)
Pt to ED via POV with c/o pelvic pain/cramping, back pain, and vomiting. Pt states was seen at PCP last week and prescriped macrobid for UTI, pt states symptoms not any better. Pt states pain increased last night and has since been vomiting. Pt A&O x4, ambulatory to triage.

## 2022-12-04 NOTE — ED Notes (Signed)
POC urine pregnancy done--pt is NOT PREGNANT

## 2022-12-04 NOTE — ED Provider Notes (Signed)
-----------------------------------------   8:26 AM on 12/04/2022 -----------------------------------------  I took over care of this patient from Dr. Charna Archer.  I independently viewed and interpreted the images for the CT which reveals a 3 mm right UVJ stone.  There is also some perinephric inflammation and a possible small calyceal rupture.  Urinalysis does not show any findings of UTI.  I consulted and discussed the case with Dr. Diamantina Providence from urology who reviewed the images and recommends prescribing Flomax and pain medication and he will arrange for follow-up in the office.  On reassessment the patient is well-appearing and her pain is well-controlled.  She is stable for discharge at this time.  I counseled her on the results of the workup and plan of care.  I gave her strict return precautions and she expresses understanding.   Arta Silence, MD 12/04/22 [phone]

## 2022-12-04 NOTE — Discharge Instructions (Addendum)
Take the ibuprofen up to every 6 hours and the oxycodone if needed for breakthrough pain.  Take the tamsulosin nightly to help pass the stone.  Follow-up with the urologist; the office should contact you in the next 1 to 2 days for follow-up next week.  Return to the ER for new, worsening, or persistent severe pain, fever, vomiting, or any other new or worsening symptoms that concern you.

## 2022-12-04 NOTE — Telephone Encounter (Signed)
TOC follow [facility] ER visit today for kidney stones. Pt states she is feeling about the same but taking medications prescribed for pain and n/v. Pt has appt with Neurology in the am.

## 2022-12-05 ENCOUNTER — Ambulatory Visit: Payer: BC Managed Care – PPO | Admitting: Urology

## 2022-12-05 ENCOUNTER — Telehealth: Payer: Self-pay

## 2022-12-05 DIAGNOSIS — N201 Calculus of ureter: Secondary | ICD-10-CM

## 2022-12-05 DIAGNOSIS — N200 Calculus of kidney: Secondary | ICD-10-CM

## 2022-12-05 MED ORDER — KETOROLAC TROMETHAMINE 10 MG PO TABS: 10.0000 mg | ORAL_TABLET | Freq: Four times a day (QID) | ORAL | 0 refills | Status: DC | PRN

## 2022-12-05 NOTE — Progress Notes (Signed)
12/05/22 10:08 AM   Rebecca Orozco 1981-02-28 [id number]  CC: Right distal ureteral stone  HPI: 42 year old female who presented to the ER on 12/04/2022 for right-sided flank pain.  CT showed a 3 mm right distal ureteral stone with upstream hydronephrosis, punctate left upper pole nonobstructive stone.  Urinalysis was benign and she was discharged with medical expulsive therapy.  She also had a negative urine culture with PCP on 11/28/22.  She denies any fevers, chills or dysuria.  She is having some intermittent flank pain in the afternoons and evenings.  Pain currently controlled with NSAIDs and Percocet.  No prior stone episodes.  She is on topiramate for headaches that she started about 3 months ago.   PMH: Past Medical History:  Diagnosis Date   Anxiety and depression    Cervical dysplasia    Chronic hypertension affecting pregnancy 05/18/2020   Flu    flu meningitis as infant   History of meningitis    HTN (hypertension)    Migraines    Sinus pressure 08/10/2021   Vaginal Pap smear, abnormal     Surgical History: Past Surgical History:  Procedure Laterality Date   TONSILLECTOMY     VARICOSE VEIN SURGERY Right       Family History: Family History  Problem Relation Age of Onset   Colon cancer Mother 81   Hypertension Mother    Breast cancer Paternal Aunt        late 61s    Social History:  reports that she has never smoked. She has never used smokeless tobacco. She reports that she does not currently use alcohol. She reports that she does not use drugs.  Physical Exam:  Constitutional:  Alert and oriented, No acute distress. Cardiovascular: No clubbing, cyanosis, or edema. Respiratory: Normal respiratory effort, no increased work of breathing. GI: Abdomen is soft, nontender, nondistended, no abdominal masses   Laboratory Data: Urine culture 3/19 no growth, UA 3/25 benign aside from microscopic hematuria   Pertinent Imaging: I have personally viewed and  interpreted the CT scan dated 12/04/2022 showing a 3 mm right distal ureteral stone with upstream hydronephrosis, mild to moderate perinephric stranding, punctate contralateral left upper pole nonobstructing renal stone  Assessment & Plan:   42 year old female with 3 mm right distal ureteral stone, no clinical or laboratory evidence of infection.  We discussed various treatment options for urolithiasis including observation with or without medical expulsive therapy, shockwave lithotripsy (SWL), ureteroscopy and laser lithotripsy with stent placement, and percutaneous nephrolithotomy.  We discussed that management is based on stone size, location, density, patient co-morbidities, and patient preference. Stones <25mm in size have a >80% spontaneous passage rate. Data surrounding the use of tamsulosin for medical expulsive therapy is controversial, but meta analyses suggests it is most efficacious for distal stones between 5-27mm in size. Possible side effects include dizziness/lightheadedness, and retrograde ejaculation.SWL has a lower stone free rate in a single procedure, but also a lower complication rate compared to ureteroscopy and avoids a stent and associated stent related symptoms. Possible complications include renal hematoma, steinstrasse, and need for additional treatment.Ureteroscopy with laser lithotripsy and stent placement has a higher stone free rate than SWL in a single procedure, however increased complication rate including possible infection, ureteral injury, bleeding, and stent related morbidity. Common stent related symptoms include dysuria, urgency/frequency, and flank pain.  After an extensive discussion of the risks and benefits of the above treatment options, the patient would like to proceed with medical expulsive therapy.  Return precautions  were discussed at length.  Toradol sent in.  RTC 2 weeks symptom check, sooner if problems to consider ureteroscopy/laser/stent.  I also  recommended finding an alternative to the Topamax  Nickolas Madrid, MD 12/05/2022  [place] [address], [place], [place] [address] [phone]

## 2022-12-05 NOTE — Transitions of Care (Post Inpatient/ED Visit) (Signed)
   12/05/2022  Name: Rebecca Orozco MRN: [id number] DOB: 1980/11/13  Today's TOC FU Call Status: Today's TOC FU Call Status:: Successful TOC FU Call Competed TOC FU Call Complete Date: 12/05/22  Transition Care Management Follow-up Telephone Call Date of Discharge: 12/04/22 Discharge Facility: [facility]) Type of Discharge: Emergency Department Reason for ED Visit: Other: (Ureterolithiasis) How have you been since you were released from the hospital?: Better Any questions or concerns?: No  Items Reviewed: Did you receive and understand the discharge instructions provided?: Yes Medications obtained and verified?: Yes (Medications Reviewed) Any new allergies since your discharge?: No Dietary orders reviewed?: NA Do you have support at home?: Yes  Home Care and Equipment/Supplies: [place] Ordered?: NA Any new equipment or medical supplies ordered?: NA  Functional Questionnaire: Do you need assistance with bathing/showering or dressing?: No Do you need assistance with meal preparation?: No Do you need assistance with eating?: No Do you have difficulty maintaining continence: No Do you need assistance with getting out of bed/getting out of a chair/moving?: No Do you have difficulty managing or taking your medications?: No  Follow up appointments reviewed: PCP Follow-up appointment confirmed?: No (pt needs hosp fu appt by 12-18-22 - please call pt to schedule fu appt) MD Provider Line Number:[phone] Given: Yes Follow-up Provider: Alma Friendly NP [facility] Follow-up appointment confirmed?: Yes Date of Specialist follow-up appointment?: 12/05/22 Follow-Up Specialty Provider:: Dr Diamantina Providence Do you need transportation to your follow-up appointment?: No Do you understand care options if your condition(s) worsen?: Yes-patient verbalized understanding    [place] LPN [place] Direct Dial  [phone]

## 2022-12-05 NOTE — Patient Instructions (Signed)
Kidney Stones  Kidney stones are solid, rock-like deposits that form inside of the kidneys. The kidneys are a pair of organs that make urine. A kidney stone may form in a kidney and move into other parts of the urinary tract, including the tubes that connect the kidneys to the bladder (ureters), the bladder, and the tube that carries urine out of the body (urethra). As the stone moves through these areas, it can cause intense pain and block the flow of urine. Kidney stones are created when high levels of certain minerals are found in the urine. The stones are usually passed out of the body through urination, but in some cases, medical treatment may be needed to remove them. What are the causes? Kidney stones may be caused by: A condition in which certain glands produce too much parathyroid hormone (primary hyperparathyroidism), which causes too much calcium buildup in the blood. A buildup of uric acid crystals in the bladder (hyperuricosuria). Uric acid is a chemical that the body produces when you eat certain foods. It usually leaves the body in the urine. Narrowing (stricture) of one or both of the ureters. A kidney blockage that is present at birth (congenital obstruction). Past surgery on the kidney or the ureters. What increases the risk? The following factors may make you more likely to develop this condition: Having had a kidney stone in the past. Having a family history of kidney stones. Not drinking enough water. Eating a diet that is high in protein, salt (sodium), or sugar. Being overweight or obese. What are the signs or symptoms? Symptoms of a kidney stone may include: Pain in the side of the abdomen, right below the ribs (flank pain). Pain usually spreads (radiates) to the groin. Needing to urinate often or urgently. Painful urination. Blood in the urine (hematuria). Nausea. Vomiting. Fever and chills. How is this diagnosed? This condition may be diagnosed based on: Your  symptoms and medical history. A physical exam. Blood tests. Urine tests. These may be done before and after the stone passes out of your body through urination. Imaging tests, such as a CT scan, abdominal X-ray, or ultrasound. A procedure to examine the inside of the bladder (cystoscopy). How is this treated? Treatment for kidney stones depends on the size, location, and makeup of the stones. Kidney stones will often pass out of the body through urination. You may need to: Increase your fluid intake to help pass the stone. In some cases, you may be given fluids through an IV and may need to be monitored in the hospital. Take medicine for pain. Make changes in your diet to help prevent kidney stones from coming back. Sometimes, procedures are needed to remove a kidney stone. This may involve: A procedure to break up kidney stones using: A focused beam of light (laser therapy). Shock waves (extracorporeal shock wave lithotripsy). Surgery to remove kidney stones. This may be needed if you have severe pain or have stones that block your urinary tract. Follow these instructions at home: Medicines Take over-the-counter and prescription medicines only as told by your health care provider. Ask your health care provider if the medicine prescribed to you requires you to avoid driving or using heavy machinery. Eating and drinking Drink enough fluid to keep your urine pale yellow. You may be instructed to drink at least 8-10 glasses of water each day. This will help you pass the kidney stone. If directed, change your diet. This may include: Limiting how much sodium you eat. Eating more fruits   and vegetables. Limiting how much animal protein you eat. Animal proteins include red meat, poultry, fish, and eggs. Eating a normal amount of calcium (1,000-1,300 mg per day). Follow instructions from your health care provider about eating or drinking restrictions. General instructions Collect urine samples as  told by your health care provider. You may need to collect a urine sample: 24 hours after you pass the stone. 8-12 weeks after you pass the kidney stone, and every 6-12 months after that. Strain your urine every time you urinate, for as long as directed. Use the strainer that your health care provider recommends. Do not throw out the kidney stone after passing it. Keep the stone so it can be tested by your health care provider. Testing the makeup of your kidney stone may help prevent you from getting kidney stones in the future. Keep all follow-up visits. You may need follow-up X-rays or ultrasounds to make sure that your stone has passed. How is this prevented? To prevent another kidney stone: Drink enough fluid to keep your urine pale yellow. This is the best way to prevent kidney stones. Eat a healthy diet. Follow recommendations from your health care provider about foods to avoid. Recommendations vary depending on the type of kidney stone that you have. You may be instructed to eat a low-protein diet. Maintain a healthy weight. Where to find more information National Kidney Foundation (NKF): www.kidney.org [employer] (UCF): www.urologyhealth.org Contact a health care provider if: You have pain that gets worse or does not get better with medicine. Get help right away if: You have a fever or chills. You develop severe pain. You develop new abdominal pain. You faint. You are unable to urinate. Summary Kidney stones are solid, rock-like deposits that form inside of the kidneys. Kidney stones can cause nausea, vomiting, blood in the urine, abdominal pain, and the urge to urinate often. Treatment for kidney stones depends on the size, location, and makeup of the stones. Kidney stones will often pass out of the body through urination. Kidney stones can be prevented by drinking enough fluids, eating a healthy diet, and maintaining a healthy weight. This information is not intended  to replace advice given to you by your health care provider. Make sure you discuss any questions you have with your health care provider. Document Revised: 12/07/2021 Document Reviewed: 12/07/2021 Elsevier Patient Education  2023 Elsevier Inc.  

## 2022-12-19 ENCOUNTER — Ambulatory Visit: Payer: BC Managed Care – PPO | Admitting: Urology

## 2022-12-26 ENCOUNTER — Inpatient Hospital Stay: Payer: BC Managed Care – PPO | Admitting: Primary Care

## 2023-02-06 ENCOUNTER — Other Ambulatory Visit: Payer: Self-pay | Admitting: Primary Care

## 2023-02-06 DIAGNOSIS — I10 Essential (primary) hypertension: Secondary | ICD-10-CM

## 2023-02-14 ENCOUNTER — Ambulatory Visit: Payer: BC Managed Care – PPO | Admitting: Primary Care

## 2023-02-14 ENCOUNTER — Encounter: Payer: Self-pay | Admitting: Primary Care

## 2023-02-14 ENCOUNTER — Telehealth: Payer: Self-pay

## 2023-02-14 ENCOUNTER — Ambulatory Visit
Admission: RE | Admit: 2023-02-14 | Discharge: 2023-02-14 | Disposition: A | Discharge status: Home / Self Care | Payer: BC Managed Care – PPO | Source: Ambulatory Visit | Attending: Primary Care | Admitting: Primary Care

## 2023-02-14 VITALS — BP 134/88 | HR 70 | Temp 97.7°F | Ht 60.25 in | Wt 212.0 lb

## 2023-02-14 DIAGNOSIS — G2581 Restless legs syndrome: Secondary | ICD-10-CM

## 2023-02-14 DIAGNOSIS — R519 Headache, unspecified: Secondary | ICD-10-CM

## 2023-02-14 DIAGNOSIS — F341 Dysthymic disorder: Secondary | ICD-10-CM | POA: Diagnosis not present

## 2023-02-14 DIAGNOSIS — M79661 Pain in right lower leg: Secondary | ICD-10-CM | POA: Diagnosis present

## 2023-02-14 DIAGNOSIS — G43009 Migraine without aura, not intractable, without status migrainosus: Secondary | ICD-10-CM

## 2023-02-14 LAB — CBC
HCT: 38.9 % (ref 36.0–46.0)
Hemoglobin: 13 g/dL (ref 12.0–15.0)
MCHC: 33.5 g/dL (ref 30.0–36.0)
MCV: 91.7 fl (ref 78.0–100.0)
Platelets: 258 10*3/uL (ref 150.0–400.0)
RBC: 4.25 Mil/uL (ref 3.87–5.11)
RDW: 12.3 % (ref 11.5–15.5)
WBC: 5 10*3/uL (ref 4.0–10.5)

## 2023-02-14 LAB — IBC + FERRITIN
Ferritin: 45.1 ng/mL (ref 10.0–291.0)
Iron: 67 ug/dL (ref 42–145)
Saturation Ratios: 21.1 % (ref 20.0–50.0)
TIBC: 317.8 ug/dL (ref 250.0–450.0)
Transferrin: 227 mg/dL (ref 212.0–360.0)

## 2023-02-14 MED ORDER — ESCITALOPRAM OXALATE 10 MG PO TABS: 10.0000 mg | ORAL_TABLET | Freq: Every day | ORAL | 0 refills | Status: DC

## 2023-02-14 NOTE — Telephone Encounter (Signed)
Received call from [facility],  Victorino Dike advised the ultrasound came back negative for DVT.

## 2023-02-14 NOTE — Assessment & Plan Note (Signed)
Uncontrolled since discontinuation of topiramate.  Long discussion about treatment options.  She is already managed on labetalol for hypertension, she has experienced bad side effects with amitriptyline.  We discussed the risks of potentially resuming topiramate at 50 mg at bedtime versus neurology referral. She prefers to resume topiramate. Since she has done very well on topiramate historically, we will resume at 50 mg at bedtime.  She will notify if she experiences any symptoms of renal stones. 

## 2023-02-14 NOTE — Assessment & Plan Note (Signed)
Discontinue Zoloft for potential restless leg side effects.  Start Lexapro 10 mg daily. She will update her psychiatrist.

## 2023-02-14 NOTE — Assessment & Plan Note (Signed)
Unclear etiology.  Will check iron levels to rule out iron deficiency anemia.  Reviewed side effects of Zoloft which do include hyperactivity of muscles which could be contributing.  She does recall that her symptoms began shortly after resuming Zoloft after discontinuation.  Will discontinue Zoloft, switch to Lexapro which does not seem to cause the side effects.  Discussed to stretch before bed and in the morning upon waking.  Consider gabapentin if no improvement.

## 2023-02-14 NOTE — Telephone Encounter (Signed)
Noted.  See result note.  

## 2023-02-14 NOTE — Patient Instructions (Signed)
Stop taking Zoloft.  Start Lexapro 10 mg daily.  Stop by the lab prior to leaving today. I will notify you of your results once received.   You will either be contacted via phone regarding your referral for your ultrasound, or you may receive a letter on your MyChart portal from our referral team with instructions for scheduling an appointment. Please let us know if you have not been contacted by anyone within two days.  Remember to stretch before bed and upon waking.  Resume topiramate 50 mg at bedtime for headache prevention.  Please update me regarding your symptoms

## 2023-02-14 NOTE — Progress Notes (Signed)
Subjective:    Patient ID: Rebecca Orozco, female    DOB: 1981/08/15, 42 y.o.   MRN: [id number]  Leg Pain     Rebecca Orozco is a very pleasant 42 y.o. female with a history of hypertension, preeclampsia, obesity, frequent headaches, insomnia who presents today to discuss lower extremity pain and headaches.  1) Frequent Headaches: Previously managed on topiramate 50 mg at bedtime for headache prevention and rizatriptan 5 mg as needed for migraines.  Unfortunately, she experienced a 3 mm right distal ureteral stone with upstream hydronephrosis in March 2024 which was captured by CT scan.  Evaluated by urology in March 2024 who recommended she discontinue topiramate as this could have caused the stone.  Since discontinuation of topiramate she has noticed increased frequency of headaches. She's experiencing daily headaches which occur to the right occipital lobe with radiation to the right parietal lobe for which she describes as pounding and throbbing. She will take Advil and Tylenol without improvement.   She's tried amitriptyline in the past which caused bloating.  She has been on topiramate intermittently throughout the years, highest dose of 200 mg at a time without experiencing renal stones.  2) Restless Legs: Acute for the last 6 weeks.  Located to the right lower extremity from the knee down to her feet. that occurs only at night. Within 5 minutes of getting in bed she notices restlessness to her right anterior shin, feels that she has to "shake it out". She will then take her Trazodone and notice symptoms until she falls asleep. She does notice intermittent achy pain to her right calf, occurs 1-2 days weekly while walking, "feels like a knot". Lasts for a few hours. She had her great saphenous vein removed at age 42.   About 10 years ago she experienced the same restlessness to right lower extremity, was treated with meloxicam which didn't help. Her symptoms eventually resolved.   She denies  recent long travel, more sedentary activity, increased calf swelling, calf redness.   She does have chronic lower back pain, worse with standing and during the day.  Typically treats this with massage.   Review of Systems  Cardiovascular:  Negative for leg swelling.  Musculoskeletal:  Positive for myalgias.  Skin:  Negative for color change.  Neurological:  Positive for headaches. Negative for weakness.       Restless right lower extremity         Past Medical History:  Diagnosis Date   Anxiety and depression    Cervical dysplasia    Chronic hypertension affecting pregnancy 05/18/2020   Flu    flu meningitis as infant   History of meningitis    HTN (hypertension)    Migraines    Pre-eclampsia superimposed on chronic hypertension 05/29/2020   Sinus pressure 08/10/2021   Vaginal Pap smear, abnormal     Social History   Socioeconomic History   Marital status: Married    Spouse name: Not on file   Number of children: Not on file   Years of education: Not on file   Highest education level: Not on file  Occupational History   Not on file  Tobacco Use   Smoking status: Never   Smokeless tobacco: Never   Tobacco comments:    tobacco use - no  Vaping Use   Vaping Use: Never used  Substance and Sexual Activity   Alcohol use: Not Currently    Alcohol/week: 0.0 standard drinks of alcohol   Drug use: No  Sexual activity: Not Currently  Other Topics Concern   Not on file  Social History Narrative   Full time teacher at [employer]; gets regular exercise.    Social Determinants of Health   Financial Resource Strain: Not on file  Food Insecurity: Not on file  Transportation Needs: Not on file  Physical Activity: Not on file  Stress: Not on file  Social Connections: Not on file  Intimate Partner Violence: Not on file    Past Surgical History:  Procedure Laterality Date   TONSILLECTOMY     VARICOSE VEIN SURGERY Right     Family History  Problem Relation  Age of Onset   Colon cancer Mother 22   Hypertension Mother    Breast cancer Paternal Aunt        late 28s    Allergies  Allergen Reactions   Amoxicillin-Pot Clavulanate Nausea And Vomiting   Latex Other (See Comments)   Propoxyphene Nausea And Vomiting    Current Outpatient Medications on File Prior to Visit  Medication Sig Dispense Refill   fluticasone (FLONASE) 50 MCG/ACT nasal spray Place 1-2 sprays into both nostrils daily as needed for allergies or rhinitis.     labetalol (NORMODYNE) 200 MG tablet TAKE ONE (1) TABLET BY MOUTH TWO TIMES PER DAY FOR BLOOD PRESSURE. 180 tablet 0   rizatriptan (MAXALT-MLT) 5 MG disintegrating tablet Take 1 tablet by mouth at migraine onset. May repeat in 2 hours if needed 10 tablet 0   traZODone (DESYREL) 100 MG tablet Take 100 mg by mouth at bedtime.     ondansetron (ZOFRAN-ODT) 4 MG disintegrating tablet Take 1 tablet (4 mg total) by mouth every 8 (eight) hours as needed for nausea or vomiting. (Patient not taking: Reported on 02/14/2023) 15 tablet 0   topiramate (TOPAMAX) 50 MG tablet Take 1 tablet (50 mg total) by mouth at bedtime. (Patient not taking: Reported on 12/05/2022) 90 tablet 0   No current facility-administered medications on file prior to visit.    BP 134/88   Pulse 70   Temp 97.7 F (36.5 C) (Temporal)   Ht 5' 0.25" (1.53 m)   Wt 212 lb (96.2 kg)   SpO2 97%   BMI 41.06 kg/m  Objective:   Physical Exam Constitutional:      General: She is not in acute distress. Cardiovascular:     Rate and Rhythm: Normal rate and regular rhythm.  Pulmonary:     Effort: Pulmonary effort is normal.  Musculoskeletal:     Lumbar back: Negative right straight leg raise test and negative left straight leg raise test.     Comments: 5 out of 5 strength to bilateral lower extremities  Skin:    General: Skin is warm and dry.     Findings: No erythema.  Neurological:     Comments: Persistent intentional movement of right lower extremity well  sitting down HPI           Assessment & Plan:  Restless legs Assessment & Plan: Unclear etiology.  Will check iron levels to rule out iron deficiency anemia.  Reviewed side effects of Zoloft which do include hyperactivity of muscles which could be contributing.  She does recall that her symptoms began shortly after resuming Zoloft after discontinuation.  Will discontinue Zoloft, switch to Lexapro which does not seem to cause the side effects.  Discussed to stretch before bed and in the morning upon waking.  Consider gabapentin if no improvement.  Orders: -     CBC -  IBC + Ferritin  Right calf pain -     US Venous Img Lower Unilateral Right (DVT); Future  Migraine without aura and without status migrainosus, not intractable Assessment & Plan: Uncontrolled since discontinuation of topiramate.  Long discussion about treatment options.  She is already managed on labetalol for hypertension, she has experienced bad side effects with amitriptyline.  We discussed the risks of potentially resuming topiramate at 50 mg at bedtime versus neurology referral. She prefers to resume topiramate. Since she has done very well on topiramate historically, we will resume at 50 mg at bedtime.  She will notify if she experiences any symptoms of renal stones.   Frequent headaches Assessment & Plan: Uncontrolled since discontinuation of topiramate.  Long discussion about treatment options.  She is already managed on labetalol for hypertension, she has experienced bad side effects with amitriptyline.  We discussed the risks of potentially resuming topiramate at 50 mg at bedtime versus neurology referral. She prefers to resume topiramate. Since she has done very well on topiramate historically, we will resume at 50 mg at bedtime.  She will notify if she experiences any symptoms of renal stones.   DEPRESSION/ANXIETY Assessment & Plan: Discontinue Zoloft for potential restless leg side  effects.  Start Lexapro 10 mg daily. She will update her psychiatrist.  Orders: -     Escitalopram Oxalate; Take 1 tablet (10 mg total) by mouth daily. for anxiety and depression.  Dispense: 90 tablet; Refill: 0        Doreene Nest, NP

## 2023-02-14 NOTE — Assessment & Plan Note (Signed)
Uncontrolled since discontinuation of topiramate.  Long discussion about treatment options.  She is already managed on labetalol for hypertension, she has experienced bad side effects with amitriptyline.  We discussed the risks of potentially resuming topiramate at 50 mg at bedtime versus neurology referral. She prefers to resume topiramate. Since she has done very well on topiramate historically, we will resume at 50 mg at bedtime.  She will notify if she experiences any symptoms of renal stones.

## 2023-07-11 ENCOUNTER — Encounter: Payer: Self-pay | Admitting: Primary Care

## 2023-07-11 ENCOUNTER — Ambulatory Visit: Payer: BC Managed Care – PPO | Admitting: Primary Care

## 2023-07-11 VITALS — BP 124/68 | HR 80 | Temp 97.4°F | Ht 60.25 in | Wt 226.0 lb

## 2023-07-11 DIAGNOSIS — R519 Headache, unspecified: Secondary | ICD-10-CM | POA: Diagnosis not present

## 2023-07-11 DIAGNOSIS — J019 Acute sinusitis, unspecified: Secondary | ICD-10-CM | POA: Diagnosis not present

## 2023-07-11 LAB — POCT RAPID STREP A (OFFICE): Rapid Strep A Screen: NEGATIVE

## 2023-07-11 MED ORDER — TOPIRAMATE 50 MG PO TABS: 50.0000 mg | ORAL_TABLET | Freq: Every day | ORAL | 0 refills | Status: DC

## 2023-07-11 MED ORDER — AMOXICILLIN 875 MG PO TABS
875.0000 mg | ORAL_TABLET | Freq: Two times a day (BID) | ORAL | 0 refills | Status: AC
Start: 2023-07-11 — End: 2023-07-18

## 2023-07-11 MED ORDER — DOXYCYCLINE HYCLATE 100 MG PO TABS
100.0000 mg | ORAL_TABLET | Freq: Two times a day (BID) | ORAL | 0 refills | Status: DC
Start: 2023-07-11 — End: 2023-07-11

## 2023-07-11 NOTE — Progress Notes (Signed)
Acute Office Visit  Subjective:     Patient ID: Rebecca Orozco, female    DOB: Sep 20, 1980, 42 y.o.   MRN: [id number]  Chief Complaint  Patient presents with   Sore Throat    Sore throat, left ear ache, congestion. Sx started 2 weeks ago  Denies Fever Covid test Monday was negative    Sore Throat  Associated symptoms include congestion, diarrhea, ear pain and headaches. Pertinent negatives include no abdominal pain, ear discharge, neck pain, shortness of breath or vomiting.    Rebecca Orozco is a pleasant 42 y.o. female with a history of hypertension, preeclampsia, obesity, frequent headaches, insomnia who presents today to discuss sore throat and ear pain.  Symptom onset 2 weeks ago with painful sore throat. Then her right ear began hurting, followed by pain in left ear that radiates to the left side of her jaw. Her sinuses feel "tight and puffy" and she has some frontal headaches. She is not coughing up any phlegm and denies post-nasal drip. Her child began feeling sick 3 weeks ago and she is taking him to the pediatrician after this appointment because he has not improved yet either.   The ear and jaw pain are the most bothersome symptoms right now. Her pain is 5/10. Her sore throat and sinuses also persist but are less bothersome to her. She uses numbing cough drops and OTC Advil twice a day for pain which helps. She took a Covid test 2 days ago that was negative. Symptoms have not improved in 2 weeks.    Review of Systems  Constitutional:  Negative for chills and fever.  HENT:  Positive for congestion, ear pain, sinus pain and sore throat. Negative for ear discharge, hearing loss and nosebleeds.   Eyes:  Negative for blurred vision and double vision.  Respiratory:  Negative for shortness of breath.   Cardiovascular:  Negative for chest pain.  Gastrointestinal:  Positive for diarrhea. Negative for abdominal pain, nausea and vomiting.  Musculoskeletal:  Negative for neck pain.   Neurological:  Positive for headaches. Negative for dizziness, sensory change, focal weakness and weakness.  Psychiatric/Behavioral:  The patient is not nervous/anxious.        Objective:    BP 124/68   Pulse 80   Temp (!) 97.4 F (36.3 C) (Temporal)   Ht 5' 0.25" (1.53 m)   Wt 226 lb (102.5 kg)   SpO2 98%   BMI 43.77 kg/m   BP Readings from Last 3 Encounters:  07/11/23 124/68  02/14/23 134/88  12/04/22 (!) 161/95   Wt Readings from Last 3 Encounters:  07/11/23 226 lb (102.5 kg)  02/14/23 212 lb (96.2 kg)  12/04/22 210 lb 8.6 oz (95.5 kg)    Physical Exam Constitutional:      Appearance: Normal appearance.  HENT:     Head: Normocephalic and atraumatic.     Right Ear: Tympanic membrane, ear canal and external ear normal. There is no impacted cerumen.     Left Ear: Tympanic membrane, ear canal and external ear normal. There is no impacted cerumen.     Nose: Congestion present. No rhinorrhea.     Mouth/Throat:     Mouth: Mucous membranes are moist.     Pharynx: Oropharynx is clear. No oropharyngeal exudate or posterior oropharyngeal erythema.  Eyes:     Pupils: Pupils are equal, round, and reactive to light.  Cardiovascular:     Rate and Rhythm: Normal rate and regular rhythm.     Heart sounds:  Normal heart sounds.  Pulmonary:     Effort: Pulmonary effort is normal.     Breath sounds: Normal breath sounds.  Musculoskeletal:     Cervical back: Normal range of motion and neck supple. No rigidity or tenderness.  Skin:    General: Skin is warm and dry.  Neurological:     General: No focal deficit present.     Mental Status: She is alert and oriented to person, place, and time.  Psychiatric:        Mood and Affect: Mood normal.        Behavior: Behavior normal.    Results for orders placed or performed in visit on 07/11/23  POCT rapid strep A  Result Value Ref Range   Rapid Strep A Screen Negative Negative      Assessment & Plan:   Problem List Items  Addressed This Visit       Respiratory   Acute non-recurrent sinusitis - Primary    Ongoing for 3 weeks.  Strep test negative in office today.   Start Amoxicillin 875 mg BID for 7 days.  She is only sensitive to clavulanate, not Amoxicillin. She declined alternative antibiotic of Doxycycline.    Discussed continuing supportive care remedies, including hydration, nasal sprays, OTC pain meds.   She will update via MyChart if no improvement.       Relevant Medications   amoxicillin (AMOXIL) 875 MG tablet   Other Relevant Orders   POCT rapid strep A (Completed)     Other   Frequent headaches   Relevant Medications   sertraline (ZOLOFT) 100 MG tablet   topiramate (TOPAMAX) 50 MG tablet    Meds ordered this encounter  Medications   topiramate (TOPAMAX) 50 MG tablet    Sig: Take 1 tablet (50 mg total) by mouth at bedtime. For headache prevention    Dispense:  90 tablet    Refill:  0    Order Specific Question:   Supervising Provider    Answer:   BEDSOLE, AMY E [[id number]]   DISCONTD: doxycycline (VIBRA-TABS) 100 MG tablet    Sig: Take 1 tablet (100 mg total) by mouth 2 (two) times daily.    Dispense:  14 tablet    Refill:  0    Order Specific Question:   Supervising Provider    Answer:   BEDSOLE, AMY E [[id number]]   amoxicillin (AMOXIL) 875 MG tablet    Sig: Take 1 tablet (875 mg total) by mouth 2 (two) times daily for 7 days.    Dispense:  14 tablet    Refill:  0    Order Specific Question:   Supervising Provider    Answer:   BEDSOLE, AMY E [[id number]]    No follow-ups on file.  Benito Mccreedy, RN

## 2023-07-11 NOTE — Patient Instructions (Addendum)
Start Amoxicillin 1 tablet twice a day for 7 days.  Continue supportive care remedies, including hydration, nasal sprays, OTC pain meds.   Please reach out if you are not seeing any improvement in about a week.   It was a pleasure to see you today!

## 2023-07-11 NOTE — Assessment & Plan Note (Addendum)
Given duration of symptoms, coupled with lack of improvement, will treat for presumed bacterial involvement.  Strep test negative in office today.   Start Amoxicillin 875 mg BID for 7 days.  She is only sensitive to clavulanate, not Amoxicillin. She declined alternative antibiotic of Doxycycline.    Discussed continuing supportive care remedies, including hydration, nasal sprays, OTC pain meds.   She will update via MyChart if no improvement.   I evaluated patient, was consulted regarding treatment, and agree with assessment and plan per Tenna Delaine, RN, DNP student.   Mayra Reel, NP-C

## 2023-07-11 NOTE — Progress Notes (Signed)
Subjective:    Patient ID: Rebecca Orozco, female    DOB: Jul 07, 1981, 42 y.o.   MRN: [id number]  Sore Throat  Associated symptoms include congestion and headaches. Pertinent negatives include no shortness of breath.    Rebecca Condron is a very pleasant 42 y.o. female with a history of migraines, hypertension, restless legs, palpitations who presents today to discuss URI symptoms.  She is also needing refills of her Topamax.  Symptom onset two weeks ago with sore throat. She then developed left ear pain with radiation to left jaw, maxillary and frontal sinus pressure, nasal congestion, and frontal headaches, and mild non productive cough.    She took a Covid-19 test two days ago which was negative. She denies fevers, chills. Her child has been sick for 3 weeks, is still not well. She is feeling about the same.    Review of Systems  Constitutional:  Negative for chills and fever.  HENT:  Positive for congestion, sinus pressure, sinus pain and sore throat.   Respiratory:  Negative for shortness of breath.   Cardiovascular:  Negative for chest pain.  Neurological:  Positive for headaches.         Past Medical History:  Diagnosis Date   Anxiety and depression    Cervical dysplasia    Chronic hypertension affecting pregnancy 05/18/2020   Flu    flu meningitis as infant   History of meningitis    HTN (hypertension)    Migraines    Pre-eclampsia superimposed on chronic hypertension 05/29/2020   Sinus pressure 08/10/2021   Vaginal Pap smear, abnormal     Social History   Socioeconomic History   Marital status: Married    Spouse name: Not on file   Number of children: Not on file   Years of education: Not on file   Highest education level: Not on file  Occupational History   Not on file  Tobacco Use   Smoking status: Never   Smokeless tobacco: Never   Tobacco comments:    tobacco use - no  Vaping Use   Vaping status: Never Used  Substance and Sexual Activity   Alcohol  use: Not Currently    Alcohol/week: 0.0 standard drinks of alcohol   Drug use: No   Sexual activity: Not Currently  Other Topics Concern   Not on file  Social History Narrative   Full time teacher at [employer]; gets regular exercise.    Social Determinants of Health   Financial Resource Strain: Not on file  Food Insecurity: Not on file  Transportation Needs: Not on file  Physical Activity: Not on file  Stress: Not on file  Social Connections: Not on file  Intimate Partner Violence: Not on file    Past Surgical History:  Procedure Laterality Date   TONSILLECTOMY     VARICOSE VEIN SURGERY Right     Family History  Problem Relation Age of Onset   Colon cancer Mother 16   Hypertension Mother    Breast cancer Paternal Aunt        late 52s    Allergies  Allergen Reactions   Amoxicillin-Pot Clavulanate Nausea And Vomiting   Latex Other (See Comments)   Propoxyphene Nausea And Vomiting   Doxycycline Nausea And Vomiting    Current Outpatient Medications on File Prior to Visit  Medication Sig Dispense Refill   fluticasone (FLONASE) 50 MCG/ACT nasal spray Place 1-2 sprays into both nostrils daily as needed for allergies or rhinitis.     rizatriptan (  MAXALT-MLT) 5 MG disintegrating tablet Take 1 tablet by mouth at migraine onset. May repeat in 2 hours if needed 10 tablet 0   sertraline (ZOLOFT) 100 MG tablet Take 100 mg by mouth daily.     traZODone (DESYREL) 100 MG tablet Take 100 mg by mouth at bedtime.     labetalol (NORMODYNE) 200 MG tablet TAKE ONE (1) TABLET BY MOUTH TWO TIMES PER DAY FOR BLOOD PRESSURE. 180 tablet 0   ondansetron (ZOFRAN-ODT) 4 MG disintegrating tablet Take 1 tablet (4 mg total) by mouth every 8 (eight) hours as needed for nausea or vomiting. (Patient not taking: Reported on 02/14/2023) 15 tablet 0   No current facility-administered medications on file prior to visit.    BP 124/68   Pulse 80   Temp (!) 97.4 F (36.3 C) (Temporal)   Ht 5' 0.25"  (1.53 m)   Wt 226 lb (102.5 kg)   SpO2 98%   BMI 43.77 kg/m  Objective:   Physical Exam Constitutional:      Appearance: She is not ill-appearing.  HENT:     Right Ear: Tympanic membrane and ear canal normal.     Left Ear: Tympanic membrane and ear canal normal.     Nose: No mucosal edema.     Right Sinus: No maxillary sinus tenderness or frontal sinus tenderness.     Left Sinus: No maxillary sinus tenderness or frontal sinus tenderness.     Mouth/Throat:     Mouth: Mucous membranes are moist.  Eyes:     Conjunctiva/sclera: Conjunctivae normal.  Cardiovascular:     Rate and Rhythm: Normal rate and regular rhythm.  Pulmonary:     Effort: Pulmonary effort is normal.     Breath sounds: Normal breath sounds. No wheezing or rhonchi.  Musculoskeletal:     Cervical back: Neck supple.  Skin:    General: Skin is warm and dry.           Assessment & Plan:  Acute non-recurrent sinusitis, unspecified location Assessment & Plan: Given duration of symptoms, coupled with lack of improvement, will treat for presumed bacterial involvement.  Strep test negative in office today.   Start Amoxicillin 875 mg BID for 7 days.  She is only sensitive to clavulanate, not Amoxicillin. She declined alternative antibiotic of Doxycycline.    Discussed continuing supportive care remedies, including hydration, nasal sprays, OTC pain meds.   She will update via MyChart if no improvement.   I evaluated patient, was consulted regarding treatment, and agree with assessment and plan per Tenna Delaine, RN, DNP student.   Mayra Reel, NP-C   Orders: -     POCT rapid strep A -     Amoxicillin; Take 1 tablet (875 mg total) by mouth 2 (two) times daily for 7 days.  Dispense: 14 tablet; Refill: 0  Frequent headaches -     Topiramate; Take 1 tablet (50 mg total) by mouth at bedtime. For headache prevention  Dispense: 90 tablet; Refill: 0        Doreene Nest, NP

## 2023-10-16 ENCOUNTER — Encounter: Payer: Self-pay | Admitting: Primary Care

## 2023-10-16 ENCOUNTER — Ambulatory Visit: Payer: 59 | Admitting: Primary Care

## 2023-10-16 VITALS — BP 136/88 | HR 80 | Temp 97.2°F | Ht 60.25 in | Wt 233.0 lb

## 2023-10-16 DIAGNOSIS — Z6841 Body Mass Index (BMI) 40.0 and over, adult: Secondary | ICD-10-CM

## 2023-10-16 DIAGNOSIS — R1011 Right upper quadrant pain: Secondary | ICD-10-CM | POA: Diagnosis not present

## 2023-10-16 DIAGNOSIS — E66813 Obesity, class 3: Secondary | ICD-10-CM | POA: Diagnosis not present

## 2023-10-16 MED ORDER — TIRZEPATIDE-WEIGHT MANAGEMENT 2.5 MG/0.5ML ~~LOC~~ SOAJ
2.5000 mg | SUBCUTANEOUS | 0 refills | Status: DC
Start: 2023-10-16 — End: 2023-11-08

## 2023-10-16 NOTE — Patient Instructions (Signed)
 You will receive a phone call regarding the ultrasound.  You will either be contacted via phone regarding your referral to healthy weight and wellness in [place], or you may receive a letter on your MyChart portal from our referral team with instructions for scheduling an appointment. Please let us  know if you have not been contacted by anyone within two weeks.  Start tirzepitide (Zepbound ) medication for weight loss.  Start by injecting 2.5 mg into the skin once weekly for 4 weeks, then increase to 5 mg once weekly thereafter.  Please notify me once you have used your last 2.5 mg dose, and I will send the increased dose of 5 mg to your pharmacy.  It was a pleasure to see you today!

## 2023-10-16 NOTE — Progress Notes (Signed)
 Subjective:    Patient ID: Rebecca Orozco, female    DOB: Mar 19, 1981, 43 y.o.   MRN: [id number]  Abdominal Pain Pertinent negatives include no constipation, diarrhea, dysuria or fever.    Rebecca Orozco is a very pleasant 43 y.o. female with a history of hypertension, palpitations, obesity who presents today to discuss abdominal pain and obesity.  1) Abdominal Pain: Her pain is located to the RUQ and epigastric region that began just prior to christmas. She's had three attacks total since then.   During each attack she experiences epigastric abdominal pain with radiation to the right upper quadrant and around to her right back.  Sometimes she experiences nausea.  During one of her attacks, she had to vomit in order to feel better.  Her most recent attack was 3 days ago with a sudden onset of sharp epigastric abdominal pain with radiation to the right upper quadrant and around her back.    She describes her pain as burning. She denies fevers, chills, body aches, urinary symptoms, diarrhea, constipation.  2) Class 3 Obesity: Chronic, progressing.  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  She denies significant changes in her diet or overeating.  Previously managed on Wegovy  and did well, unfortunately her insurance no longer covered.  There was also difficulty with stock supply.  She would like to try something again.  Body mass index is 45.13 kg/m.   Wt Readings from Last 3 Encounters:  10/16/23 233 lb (105.7 kg)  07/11/23 226 lb (102.5 kg)  02/14/23 212 lb (96.2 kg)      Review of Systems  Constitutional:  Negative for fever.  Gastrointestinal:  Positive for abdominal pain. Negative for constipation and diarrhea.  Genitourinary:  Negative for dysuria and flank pain.         Past Medical History:  Diagnosis Date   Anxiety and depression    Cervical dysplasia    Chronic hypertension affecting pregnancy 05/18/2020   Flu    flu meningitis as infant    History of meningitis    HTN (hypertension)    Migraines    Pre-eclampsia superimposed on chronic hypertension 05/29/2020   Sinus pressure 08/10/2021   Vaginal Pap smear, abnormal     Social History   Socioeconomic History   Marital status: Married    Spouse name: Not on file   Number of children: Not on file   Years of education: Not on file   Highest education level: Not on file  Occupational History   Not on file  Tobacco Use   Smoking status: Never   Smokeless tobacco: Never   Tobacco comments:    tobacco use - no  Vaping Use   Vaping status: Never Used  Substance and Sexual Activity   Alcohol use: Not Currently    Alcohol/week: 0.0 standard drinks of alcohol   Drug use: No   Sexual activity: Not Currently  Other Topics Concern   Not on file  Social History Narrative   Full time teacher at [employer]; gets regular exercise.    Social Drivers of Corporate Investment Banker Strain: Not on file  Food Insecurity: Not on file  Transportation Needs: Not on file  Physical Activity: Not on file  Stress: Not on file  Social Connections: Not on file  Intimate Partner Violence: Not on file    Past Surgical History:  Procedure Laterality Date   TONSILLECTOMY     VARICOSE VEIN SURGERY Right  Family History  Problem Relation Age of Onset   Colon cancer Mother 13   Hypertension Mother    Breast cancer Paternal Aunt        late 24s    Allergies  Allergen Reactions   Amoxicillin -Pot Clavulanate Nausea And Vomiting   Latex Other (See Comments)   Propoxyphene Nausea And Vomiting   Doxycycline  Nausea And Vomiting    Current Outpatient Medications on File Prior to Visit  Medication Sig Dispense Refill   fluticasone  (FLONASE ) 50 MCG/ACT nasal spray Place 1-2 sprays into both nostrils daily as needed for allergies or rhinitis.     labetalol  (NORMODYNE ) 200 MG tablet TAKE ONE (1) TABLET BY MOUTH TWO TIMES PER DAY FOR BLOOD PRESSURE. 180 tablet 0    ondansetron  (ZOFRAN -ODT) 4 MG disintegrating tablet Take 1 tablet (4 mg total) by mouth every 8 (eight) hours as needed for nausea or vomiting. 15 tablet 0   rizatriptan  (MAXALT -MLT) 5 MG disintegrating tablet Take 1 tablet by mouth at migraine onset. May repeat in 2 hours if needed 10 tablet 0   sertraline (ZOLOFT) 100 MG tablet Take 100 mg by mouth daily.     topiramate  (TOPAMAX ) 50 MG tablet Take 1 tablet (50 mg total) by mouth at bedtime. For headache prevention 90 tablet 0   traZODone (DESYREL) 100 MG tablet Take 100 mg by mouth at bedtime.     No current facility-administered medications on file prior to visit.    BP 136/88   Pulse 80   Temp (!) 97.2 F (36.2 C) (Temporal)   Ht 5' 0.25 (1.53 m)   Wt 233 lb (105.7 kg)   LMP  (LMP Unknown)   SpO2 95%   Breastfeeding No   BMI 45.13 kg/m  Objective:   Physical Exam Cardiovascular:     Rate and Rhythm: Normal rate.  Pulmonary:     Effort: Pulmonary effort is normal.  Abdominal:     General: Bowel sounds are normal.     Palpations: Abdomen is soft.     Tenderness: There is no abdominal tenderness.  Musculoskeletal:     Cervical back: Neck supple.  Skin:    General: Skin is warm and dry.  Neurological:     Mental Status: She is alert and oriented to person, place, and time.  Psychiatric:        Mood and Affect: Mood normal.           Assessment & Plan:  RUQ abdominal pain Assessment & Plan: Symptoms suggestive of cholecystitis versus cholelithiasis.  Right upper quadrant abdominal ultrasound ordered and pending. CMP and CBC with differential pending.  Consider general surgery referral if warranted.  Avoid trigger foods such as acidic foods, alcohol, spicy foods.   Orders: -     US  ABDOMEN LIMITED RUQ (LIVER/GB); Future -     CBC with Differential/Platelet -     Comprehensive metabolic panel  Class 3 severe obesity due to excess calories with serious comorbidity and body mass index (BMI) of 45.0 to 49.9  in adult [facility]) Assessment & Plan: Deteriorated.  Start tirzepitide (Zepbound ) medication for weight loss.  Start by injecting 2.5 mg into the skin once weekly for 4 weeks, then increase to 5 mg once weekly thereafter.    Referral placed to healthy weight wellness center in case GLP-1 agonist treatment is not covered by her insurance.  Orders: -     Amb Ref to Medical Weight Management -     Tirzepatide -Weight Management; Inject 2.5  mg into the skin once a week.  Dispense: 2 mL; Refill: 0        Comer MARLA Gaskins, NP

## 2023-10-16 NOTE — Assessment & Plan Note (Signed)
 Symptoms suggestive of cholecystitis versus cholelithiasis.  Right upper quadrant abdominal ultrasound ordered and pending. CMP and CBC with differential pending.  Consider general surgery referral if warranted.  Avoid trigger foods such as acidic foods, alcohol, spicy foods.

## 2023-10-16 NOTE — Assessment & Plan Note (Signed)
 Deteriorated.  Start tirzepitide (Zepbound ) medication for weight loss.  Start by injecting 2.5 mg into the skin once weekly for 4 weeks, then increase to 5 mg once weekly thereafter.    Referral placed to healthy weight wellness center in case GLP-1 agonist treatment is not covered by her insurance.

## 2023-10-17 LAB — CBC WITH DIFFERENTIAL/PLATELET
Basophils Absolute: 0.1 10*3/uL (ref 0.0–0.1)
Basophils Relative: 1 % (ref 0.0–3.0)
Eosinophils Absolute: 0 10*3/uL (ref 0.0–0.7)
Eosinophils Relative: 0.6 % (ref 0.0–5.0)
HCT: 39 % (ref 36.0–46.0)
Hemoglobin: 13.2 g/dL (ref 12.0–15.0)
Lymphocytes Relative: 22.4 % (ref 12.0–46.0)
Lymphs Abs: 1.6 10*3/uL (ref 0.7–4.0)
MCHC: 33.7 g/dL (ref 30.0–36.0)
MCV: 90.4 fL (ref 78.0–100.0)
Monocytes Absolute: 0.5 10*3/uL (ref 0.1–1.0)
Monocytes Relative: 6.9 % (ref 3.0–12.0)
Neutro Abs: 4.9 10*3/uL (ref 1.4–7.7)
Neutrophils Relative %: 69.1 % (ref 43.0–77.0)
Platelets: 306 10*3/uL (ref 150.0–400.0)
RBC: 4.32 Mil/uL (ref 3.87–5.11)
RDW: 12.4 % (ref 11.5–15.5)
WBC: 7.1 10*3/uL (ref 4.0–10.5)

## 2023-10-17 LAB — COMPREHENSIVE METABOLIC PANEL
ALT: 20 U/L (ref 0–35)
AST: 18 U/L (ref 0–37)
Albumin: 4.3 g/dL (ref 3.5–5.2)
Alkaline Phosphatase: 49 U/L (ref 39–117)
BUN: 12 mg/dL (ref 6–23)
CO2: 25 meq/L (ref 19–32)
Calcium: 8.7 mg/dL (ref 8.4–10.5)
Chloride: 105 meq/L (ref 96–112)
Creatinine, Ser: 0.81 mg/dL (ref 0.40–1.20)
GFR: 88.92 mL/min (ref >60.00)
Glucose, Bld: 92 mg/dL (ref 70–99)
Potassium: 3.7 meq/L (ref 3.5–5.1)
Sodium: 137 meq/L (ref 135–145)
Total Bilirubin: 0.6 mg/dL (ref 0.2–1.2)
Total Protein: 6.9 g/dL (ref 6.0–8.3)

## 2023-10-23 ENCOUNTER — Ambulatory Visit
Admission: RE | Admit: 2023-10-23 | Discharge: 2023-10-23 | Disposition: A | Discharge status: Home / Self Care | Payer: 59 | Source: Ambulatory Visit | Attending: Primary Care | Admitting: Primary Care

## 2023-10-23 DIAGNOSIS — R1011 Right upper quadrant pain: Secondary | ICD-10-CM | POA: Insufficient documentation

## 2023-11-08 ENCOUNTER — Encounter: Payer: Self-pay | Admitting: Primary Care

## 2023-11-08 ENCOUNTER — Ambulatory Visit: Payer: 59 | Admitting: Primary Care

## 2023-11-08 VITALS — BP 124/76 | HR 88 | Temp 98.1°F | Ht 60.25 in | Wt 231.0 lb

## 2023-11-08 DIAGNOSIS — J0100 Acute maxillary sinusitis, unspecified: Secondary | ICD-10-CM | POA: Diagnosis not present

## 2023-11-08 MED ORDER — AMOXICILLIN 875 MG PO TABS
875.0000 mg | ORAL_TABLET | Freq: Two times a day (BID) | ORAL | 0 refills | Status: AC
Start: 2023-11-08 — End: 2023-11-15

## 2023-11-08 NOTE — Assessment & Plan Note (Addendum)
 HPI representative.  Given duration of symptoms, coupled with lack of improvement, will treat for presumed bacterial involvement. Start amoxicillin 875 twice daily x 7 days. Allergy to clavulanate.   Continue Nettie pot rinses. Follow-up as needed.

## 2023-11-08 NOTE — Patient Instructions (Signed)
 Start Amoxil 875 mg BID for 7 days.   Continue Neti Pot rinses.   It was a pleasure to see you today!

## 2023-11-08 NOTE — Progress Notes (Signed)
 Subjective:    Patient ID: Rebecca Orozco, female    DOB: 10-03-1980, 43 y.o.   MRN: [id number]  Sinus Problem Associated symptoms include congestion, headaches, sinus pressure and a sore throat. Pertinent negatives include no coughing or shortness of breath.    Rebecca Orozco is a very pleasant 43 y.o. female with history of migraines, hypertension who presents today to discuss sinus pressure.  Symptom onset two weeks ago with sore throat. She then developed maxillary sinus pressure, bilateral otalgia, body aches, nasal congestion.   She tested negative for Covid-19 infection 1 week ago.   She's been taking nasal saline spray, neti pot, Mucinex without improvement. She is pulling thick green mucous from her nasal and sinus cavities with the [employer].   Overall she's feeling about the same.    Review of Systems  Constitutional:  Positive for fatigue. Negative for fever.  HENT:  Positive for congestion, postnasal drip, sinus pressure, sinus pain and sore throat.   Respiratory:  Negative for cough and shortness of breath.   Neurological:  Positive for headaches.         Past Medical History:  Diagnosis Date   Anxiety and depression    Cervical dysplasia    Chronic hypertension affecting pregnancy 05/18/2020   Flu    flu meningitis as infant   History of meningitis    HTN (hypertension)    Migraines    Pre-eclampsia superimposed on chronic hypertension 05/29/2020   Sinus pressure 08/10/2021   Vaginal Pap smear, abnormal     Social History   Socioeconomic History   Marital status: Married    Spouse name: Not on file   Number of children: Not on file   Years of education: Not on file   Highest education level: Not on file  Occupational History   Not on file  Tobacco Use   Smoking status: Never   Smokeless tobacco: Never   Tobacco comments:    tobacco use - no  Vaping Use   Vaping status: Never Used  Substance and Sexual Activity   Alcohol use: Not Currently     Alcohol/week: 0.0 standard drinks of alcohol   Drug use: No   Sexual activity: Not Currently  Other Topics Concern   Not on file  Social History Narrative   Full time teacher at [employer]; gets regular exercise.    Social Drivers of Corporate investment banker Strain: Not on file  Food Insecurity: Not on file  Transportation Needs: Not on file  Physical Activity: Not on file  Stress: Not on file  Social Connections: Not on file  Intimate Partner Violence: Not on file    Past Surgical History:  Procedure Laterality Date   TONSILLECTOMY     VARICOSE VEIN SURGERY Right     Family History  Problem Relation Age of Onset   Colon cancer Mother 27   Hypertension Mother    Breast cancer Paternal Aunt        late 23s    Allergies  Allergen Reactions   Amoxicillin-Pot Clavulanate Nausea And Vomiting   Latex Other (See Comments)   Propoxyphene Nausea And Vomiting   Doxycycline Nausea And Vomiting    Current Outpatient Medications on File Prior to Visit  Medication Sig Dispense Refill   labetalol (NORMODYNE) 200 MG tablet TAKE ONE (1) TABLET BY MOUTH TWO TIMES PER DAY FOR BLOOD PRESSURE. 180 tablet 0   rizatriptan (MAXALT-MLT) 5 MG disintegrating tablet Take 1 tablet by mouth at  migraine onset. May repeat in 2 hours if needed 10 tablet 0   sertraline (ZOLOFT) 100 MG tablet Take 100 mg by mouth daily.     topiramate (TOPAMAX) 50 MG tablet Take 1 tablet (50 mg total) by mouth at bedtime. For headache prevention 90 tablet 0   traZODone (DESYREL) 100 MG tablet Take 100 mg by mouth at bedtime.     fluticasone (FLONASE) 50 MCG/ACT nasal spray Place 1-2 sprays into both nostrils daily as needed for allergies or rhinitis. (Patient not taking: Reported on 11/08/2023)     No current facility-administered medications on file prior to visit.    BP 124/76   Pulse 88   Temp 98.1 F (36.7 C) (Temporal)   Ht 5' 0.25" (1.53 m)   Wt 231 lb (104.8 kg)   LMP  (LMP Unknown)   SpO2 97%    BMI 44.74 kg/m  Objective:   Physical Exam Constitutional:      Appearance: She is ill-appearing.  HENT:     Right Ear: Tympanic membrane and ear canal normal.     Left Ear: Tympanic membrane and ear canal normal.     Nose: No mucosal edema.     Right Sinus: No maxillary sinus tenderness or frontal sinus tenderness.     Left Sinus: No maxillary sinus tenderness or frontal sinus tenderness.     Mouth/Throat:     Mouth: Mucous membranes are moist.  Eyes:     Conjunctiva/sclera: Conjunctivae normal.  Cardiovascular:     Rate and Rhythm: Normal rate and regular rhythm.  Pulmonary:     Effort: Pulmonary effort is normal.     Breath sounds: Normal breath sounds. No wheezing or rhonchi.  Musculoskeletal:     Cervical back: Neck supple.  Skin:    General: Skin is warm and dry.           Assessment & Plan:  Acute non-recurrent maxillary sinusitis Assessment & Plan: HPI representative.  Given duration of symptoms, coupled with lack of improvement, will treat for presumed bacterial involvement. Start amoxicillin 875 twice daily x 7 days. Allergy to clavulanate.   Continue Nettie pot rinses. Follow-up as needed.  Orders: -     Amoxicillin; Take 1 tablet (875 mg total) by mouth 2 (two) times daily for 7 days.  Dispense: 14 tablet; Refill: 0        Doreene Nest, NP

## 2023-11-12 ENCOUNTER — Encounter (INDEPENDENT_AMBULATORY_CARE_PROVIDER_SITE_OTHER): Payer: Self-pay | Admitting: Family Medicine

## 2023-11-13 ENCOUNTER — Encounter (INDEPENDENT_AMBULATORY_CARE_PROVIDER_SITE_OTHER): Payer: Self-pay | Admitting: Family Medicine

## 2023-11-19 ENCOUNTER — Encounter (INDEPENDENT_AMBULATORY_CARE_PROVIDER_SITE_OTHER): Payer: Self-pay

## 2024-04-14 DIAGNOSIS — Z1231 Encounter for screening mammogram for malignant neoplasm of breast: Secondary | ICD-10-CM

## 2024-05-21 ENCOUNTER — Ambulatory Visit
Admission: RE | Admit: 2024-05-21 | Discharge: 2024-05-21 | Disposition: A | Discharge status: Home / Self Care | Source: Ambulatory Visit | Attending: Primary Care | Admitting: Primary Care

## 2024-05-21 DIAGNOSIS — Z1231 Encounter for screening mammogram for malignant neoplasm of breast: Secondary | ICD-10-CM | POA: Diagnosis present

## 2024-05-26 ENCOUNTER — Ambulatory Visit: Payer: Self-pay | Admitting: Primary Care

## 2024-08-25 ENCOUNTER — Ambulatory Visit: Payer: Self-pay

## 2024-08-25 NOTE — Telephone Encounter (Signed)
 FYI Only or Action Required?: FYI only for provider: appointment scheduled on 12/16.  Patient was last seen in primary care on 11/08/2023 by Gretta Comer POUR, NP.  Called Nurse Triage reporting Hypertension.  Symptoms began yesterday.  Interventions attempted: Prescription medications: Labetalol  .  Symptoms are: unchanged.  Triage Disposition: See Physician Within 24 Hours  Patient/caregiver understands and will follow disposition?: Yes        Copied from CRM #[id number]. Topic: Clinical - Red Word Triage >> Aug 25, 2024  8:55 AM [place] MATSU wrote: Red Word that prompted transfer to Nurse Triage: ocular migraine and now bp has been trending high 188/122 highest reading.  Now 156/112 Reason for Disposition  Systolic BP >= 180 OR Diastolic >= 110  Answer Assessment - Initial Assessment Questions 1. BLOOD PRESSURE: What is your blood pressure? Did you take at least two measurements 5 minutes apart?     156/112 this morning 180/120 during triage call   2. ONSET: When did you take your blood pressure?     Today   3. HOW: How did you take your blood pressure? (e.g., automatic home BP monitor, visiting nurse)     automatic home BP monitor  4. HISTORY: Do you have a history of high blood pressure?     Yes, HTN.   5. MEDICINES: Are you taking any medicines for blood pressure? Have you missed any doses recently?     Labetalol , no missed doses   6. OTHER SYMPTOMS: Do you have any symptoms? (e.g., blurred vision, chest pain, difficulty breathing, headache, weakness)    Ocular migraine noted.   Patient called in to triage with complaints of elevated B/P and an ocular migraine.  This has been ongoing for since yesterday when she took her B/P. No missed doses of Labetalol .  The patient stated she has had ocular migraines in the past, it is not like a traditional migraines, the pain is in her eyes, not head.   Appointment scheduled for further evaluation; and agrees  with the plan of care, and will reach out if symptoms worsen or persist.  Protocols used: Blood Pressure - High-A-AH

## 2024-08-25 NOTE — Telephone Encounter (Signed)
 Noted. Agree with nursing triage decision. Appreciate Dr Sherrel evaluation.

## 2024-08-25 NOTE — Telephone Encounter (Signed)
 Next Appt With Family Medicine Darra Ring, MD) 08/26/2024 at 2:40 PM

## 2024-08-26 ENCOUNTER — Encounter: Payer: Self-pay | Admitting: Family Medicine

## 2024-08-26 ENCOUNTER — Ambulatory Visit: Admitting: Family Medicine

## 2024-08-26 VITALS — BP 146/100 | HR 76 | Temp 99.3°F | Ht 60.25 in | Wt 223.4 lb

## 2024-08-26 DIAGNOSIS — I10 Essential (primary) hypertension: Secondary | ICD-10-CM

## 2024-08-26 DIAGNOSIS — G43109 Migraine with aura, not intractable, without status migrainosus: Secondary | ICD-10-CM | POA: Insufficient documentation

## 2024-08-26 MED ORDER — LABETALOL HCL 300 MG PO TABS: 300.0000 mg | ORAL_TABLET | Freq: Two times a day (BID) | ORAL | 1 refills | Status: AC

## 2024-08-26 NOTE — Progress Notes (Signed)
 Patient ID: Rebecca Orozco, female    DOB: Jan 03, 1981, 43 y.o.   MRN: [id number]  This visit was conducted in person.  BP (!) 146/100   Pulse 76   Temp 99.3 F (37.4 C) (Temporal)   Ht 5' 0.25 (1.53 m)   Wt 223 lb 6 oz (101.3 kg)   SpO2 (!) 77%   BMI 43.26 kg/m    CC:  Chief Complaint  Patient presents with   Hypertension   Ocular Migraine    Subjective:   HPI: Rebecca Orozco is a 43 y.o. female presenting on 08/26/2024 for Hypertension and Ocular Migraine  PCP Gretta   She has noted  in the last week she has  Started after ocular migraine on Friday 08/22/2024.. has been elevated since. Ocular migraine, non painful, resolved in 30-35 min.  No diet changes, some increase in stress, no weight gain.  Tired since the migraine.   Most recent ocular migraine 7 years ago.  Regular migraine only once a year.   History of migraine.. on topiramate  prevention, using maxalt  prn rescue. Hypertension:   On labetalol  200 mg daily  BID. BP Readings from Last 3 Encounters:  08/26/24 (!) 146/100  11/08/23 124/76  10/16/23 136/88  Using medication without problems or lightheadedness:  Chest pain with exertion: Edema: Short of breath: Average home BPs: Other issues:       Relevant past medical, surgical, family and social history reviewed and updated as indicated. Interim medical history since our last visit reviewed. Allergies and medications reviewed and updated. Outpatient Medications Prior to Visit  Medication Sig Dispense Refill   fluticasone  (FLONASE ) 50 MCG/ACT nasal spray Place 1-2 sprays into both nostrils daily as needed for allergies or rhinitis.     rizatriptan  (MAXALT -MLT) 5 MG disintegrating tablet Take 1 tablet by mouth at migraine onset. May repeat in 2 hours if needed 10 tablet 0   sertraline (ZOLOFT) 100 MG tablet Take 100 mg by mouth daily.     topiramate  (TOPAMAX ) 50 MG tablet Take 1 tablet (50 mg total) by mouth at bedtime. For headache prevention 90 tablet 0    traZODone (DESYREL) 100 MG tablet Take 100 mg by mouth at bedtime.     labetalol  (NORMODYNE ) 200 MG tablet TAKE ONE (1) TABLET BY MOUTH TWO TIMES PER DAY FOR BLOOD PRESSURE. 180 tablet 0   No facility-administered medications prior to visit.     Per HPI unless specifically indicated in ROS section below Review of Systems  Constitutional:  Positive for fatigue. Negative for fever.  HENT:  Negative for congestion.   Eyes:  Negative for pain.  Respiratory:  Negative for cough and shortness of breath.   Cardiovascular:  Negative for chest pain, palpitations and leg swelling.  Gastrointestinal:  Negative for abdominal pain.  Genitourinary:  Negative for dysuria and vaginal bleeding.  Musculoskeletal:  Negative for back pain.  Neurological:  Negative for syncope, light-headedness and headaches.  Psychiatric/Behavioral:  Negative for dysphoric mood.    Objective:  BP (!) 146/100   Pulse 76   Temp 99.3 F (37.4 C) (Temporal)   Ht 5' 0.25 (1.53 m)   Wt 223 lb 6 oz (101.3 kg)   SpO2 (!) 77%   BMI 43.26 kg/m   Wt Readings from Last 3 Encounters:  08/26/24 223 lb 6 oz (101.3 kg)  11/08/23 231 lb (104.8 kg)  10/16/23 233 lb (105.7 kg)      Physical Exam Constitutional:      General: She is  not in acute distress.    Appearance: Normal appearance. She is well-developed. She is not ill-appearing or toxic-appearing.  HENT:     Head: Normocephalic.     Right Ear: Hearing, tympanic membrane, ear canal and external ear normal. Tympanic membrane is not erythematous, retracted or bulging.     Left Ear: Hearing, tympanic membrane, ear canal and external ear normal. Tympanic membrane is not erythematous, retracted or bulging.     Nose: No mucosal edema or rhinorrhea.     Right Sinus: No maxillary sinus tenderness or frontal sinus tenderness.     Left Sinus: No maxillary sinus tenderness or frontal sinus tenderness.     Mouth/Throat:     Pharynx: Uvula midline.  Eyes:     General: Lids  are normal. Lids are everted, no foreign bodies appreciated.     Conjunctiva/sclera: Conjunctivae normal.     Pupils: Pupils are equal, round, and reactive to light.  Neck:     Thyroid: No thyroid mass or thyromegaly.     Vascular: No carotid bruit.     Trachea: Trachea normal.  Cardiovascular:     Rate and Rhythm: Normal rate and regular rhythm.     Pulses: Normal pulses.     Heart sounds: Normal heart sounds, S1 normal and S2 normal. No murmur heard.    No friction rub. No gallop.  Pulmonary:     Effort: Pulmonary effort is normal. No tachypnea or respiratory distress.     Breath sounds: Normal breath sounds. No decreased breath sounds, wheezing, rhonchi or rales.  Abdominal:     General: Bowel sounds are normal.     Palpations: Abdomen is soft.     Tenderness: There is no abdominal tenderness.  Musculoskeletal:     Cervical back: Normal range of motion and neck supple.  Skin:    General: Skin is warm and dry.     Findings: No rash.  Neurological:     Mental Status: She is alert.  Psychiatric:        Mood and Affect: Mood is not anxious or depressed.        Speech: Speech normal.        Behavior: Behavior normal. Behavior is cooperative.        Thought Content: Thought content normal.        Judgment: Judgment normal.       Results for orders placed or performed in visit on 10/16/23  CBC with Differential/Platelet   Collection Time: 10/16/23  2:18 PM  Result Value Ref Range   WBC 7.1 4.0 - 10.5 K/uL   RBC 4.32 3.87 - 5.11 Mil/uL   Hemoglobin 13.2 12.0 - 15.0 g/dL   HCT 60.9 63.9 - 53.9 %   MCV 90.4 78.0 - 100.0 fl   MCHC 33.7 30.0 - 36.0 g/dL   RDW 87.5 88.4 - 84.4 %   Platelets 306.0 150.0 - 400.0 K/uL   Neutrophils Relative % 69.1 43.0 - 77.0 %   Lymphocytes Relative 22.4 12.0 - 46.0 %   Monocytes Relative 6.9 3.0 - 12.0 %   Eosinophils Relative 0.6 0.0 - 5.0 %   Basophils Relative 1.0 0.0 - 3.0 %   Neutro Abs 4.9 1.4 - 7.7 K/uL   Lymphs Abs 1.6 0.7 - 4.0 K/uL    Monocytes Absolute 0.5 0.1 - 1.0 K/uL   Eosinophils Absolute 0.0 0.0 - 0.7 K/uL   Basophils Absolute 0.1 0.0 - 0.1 K/uL  Comprehensive metabolic panel   Collection Time:  10/16/23  2:18 PM  Result Value Ref Range   Sodium 137 135 - 145 mEq/L   Potassium 3.7 3.5 - 5.1 mEq/L   Chloride 105 96 - 112 mEq/L   CO2 25 19 - 32 mEq/L   Glucose, Bld 92 70 - 99 mg/dL   BUN 12 6 - 23 mg/dL   Creatinine, Ser 9.18 0.40 - 1.20 mg/dL   Total Bilirubin 0.6 0.2 - 1.2 mg/dL   Alkaline Phosphatase 49 39 - 117 U/L   AST 18 0 - 37 U/L   ALT 20 0 - 35 U/L   Total Protein 6.9 6.0 - 8.3 g/dL   Albumin 4.3 3.5 - 5.2 g/dL   GFR 11.07 >39.99 mL/min   Calcium  8.7 8.4 - 10.5 mg/dL    Assessment and Plan  Primary hypertension Assessment & Plan: Chronic with recent worsening following ocular migraine.  She had not been checking blood pressure regularly before migraine. Will evaluate for secondary causes of blood pressure elevation with lab work.  Patient denies chest pain or shortness of breath.  No neurologic changes reported and normal neurologic exam.  Will increase labetalol  to 300 mg twice daily.  We did discuss other possible medication options but since this is previously been helping with migraine control we will continue beta-blocker. Return and ER precautions provided. Follow-up with PCP in 2 to 4 weeks for repeat blood pressure evaluation.  Follow blood pressure at home and call if not improving as expected.  Orders: -     Comprehensive metabolic panel with GFR -     TSH -     CBC with Differential/Platelet  Ocular migraine Assessment & Plan: Acute, has had similar in the past, very typical.  No clear sign of TIA or CVA. Normal neurologic exam.  Symptoms now resolved.  She will continue topiramate  but we could always consider increasing if ocular migraines continue happening.   Other orders -     Labetalol  HCl; Take 1 tablet (300 mg total) by mouth 2 (two) times daily.  Dispense: 60  tablet; Refill: 1    Return in about 4 weeks (around 09/23/2024) for  follow up HTN por CPX.   Greig Ring, MD

## 2024-08-26 NOTE — Assessment & Plan Note (Signed)
 Chronic with recent worsening following ocular migraine.  She had not been checking blood pressure regularly before migraine. Will evaluate for secondary causes of blood pressure elevation with lab work.  Patient denies chest pain or shortness of breath.  No neurologic changes reported and normal neurologic exam.  Will increase labetalol  to 300 mg twice daily.  We did discuss other possible medication options but since this is previously been helping with migraine control we will continue beta-blocker. Return and ER precautions provided. Follow-up with PCP in 2 to 4 weeks for repeat blood pressure evaluation.  Follow blood pressure at home and call if not improving as expected.

## 2024-08-26 NOTE — Assessment & Plan Note (Signed)
 Acute, has had similar in the past, very typical.  No clear sign of TIA or CVA. Normal neurologic exam.  Symptoms now resolved.  She will continue topiramate  but we could always consider increasing if ocular migraines continue happening.

## 2024-08-27 ENCOUNTER — Ambulatory Visit: Payer: Self-pay | Admitting: Family Medicine

## 2024-08-27 LAB — CBC WITH DIFFERENTIAL/PLATELET
Basophils Absolute: 0 K/uL (ref 0.0–0.1)
Basophils Relative: 0.7 % (ref 0.0–3.0)
Eosinophils Absolute: 0.1 K/uL (ref 0.0–0.7)
Eosinophils Relative: 1.7 % (ref 0.0–5.0)
HCT: 36.5 % (ref 36.0–46.0)
Hemoglobin: 12.6 g/dL (ref 12.0–15.0)
Lymphocytes Relative: 24.7 % (ref 12.0–46.0)
Lymphs Abs: 1.3 K/uL (ref 0.7–4.0)
MCHC: 34.6 g/dL (ref 30.0–36.0)
MCV: 89.3 fl (ref 78.0–100.0)
Monocytes Absolute: 0.3 K/uL (ref 0.1–1.0)
Monocytes Relative: 6.5 % (ref 3.0–12.0)
Neutro Abs: 3.6 K/uL (ref 1.4–7.7)
Neutrophils Relative %: 66.4 % (ref 43.0–77.0)
Platelets: 266 K/uL (ref 150.0–400.0)
RBC: 4.09 Mil/uL (ref 3.87–5.11)
RDW: 12.7 % (ref 11.5–15.5)
WBC: 5.4 K/uL (ref 4.0–10.5)

## 2024-08-27 LAB — COMPREHENSIVE METABOLIC PANEL WITH GFR
ALT: 14 U/L (ref 3–35)
AST: 14 U/L (ref 5–37)
Albumin: 4.2 g/dL (ref 3.5–5.2)
Alkaline Phosphatase: 57 U/L (ref 39–117)
BUN: 11 mg/dL (ref 6–23)
CO2: 27 meq/L (ref 19–32)
Calcium: 8.6 mg/dL (ref 8.4–10.5)
Chloride: 103 meq/L (ref 96–112)
Creatinine, Ser: 0.79 mg/dL (ref 0.40–1.20)
GFR: 91.08 mL/min (ref >60.00)
Glucose, Bld: 99 mg/dL (ref 70–99)
Potassium: 3.8 meq/L (ref 3.5–5.1)
Sodium: 138 meq/L (ref 135–145)
Total Bilirubin: 0.6 mg/dL (ref 0.2–1.2)
Total Protein: 6.8 g/dL (ref 6.0–8.3)

## 2024-08-27 LAB — TSH: TSH: 0.9 u[IU]/mL (ref 0.35–5.50)

## 2024-09-30 ENCOUNTER — Encounter: Payer: Self-pay | Admitting: Primary Care

## 2024-09-30 ENCOUNTER — Ambulatory Visit: Admitting: Primary Care

## 2024-09-30 ENCOUNTER — Other Ambulatory Visit (HOSPITAL_COMMUNITY)
Admission: RE | Admit: 2024-09-30 | Discharge: 2024-09-30 | Disposition: A | Source: Ambulatory Visit | Attending: Primary Care | Admitting: Primary Care

## 2024-09-30 VITALS — BP 132/82 | HR 79 | Temp 98.1°F | Ht 64.75 in | Wt 225.5 lb

## 2024-09-30 DIAGNOSIS — R519 Headache, unspecified: Secondary | ICD-10-CM

## 2024-09-30 DIAGNOSIS — Z0000 Encounter for general adult medical examination without abnormal findings: Secondary | ICD-10-CM | POA: Diagnosis not present

## 2024-09-30 DIAGNOSIS — Z124 Encounter for screening for malignant neoplasm of cervix: Secondary | ICD-10-CM

## 2024-09-30 DIAGNOSIS — G43009 Migraine without aura, not intractable, without status migrainosus: Secondary | ICD-10-CM

## 2024-09-30 DIAGNOSIS — I10 Essential (primary) hypertension: Secondary | ICD-10-CM | POA: Diagnosis not present

## 2024-09-30 MED ORDER — TOPIRAMATE 100 MG PO TABS: 100.0000 mg | ORAL_TABLET | Freq: Every day | ORAL | 3 refills | Status: AC

## 2024-09-30 MED ORDER — VALSARTAN 80 MG PO TABS: 80.0000 mg | ORAL_TABLET | Freq: Every day | ORAL | 0 refills | Status: AC

## 2024-09-30 NOTE — Assessment & Plan Note (Signed)
 Declines influenza vaccine.  Pap smear due, completed today Mammogram due UTD  Discussed the importance of a healthy diet and regular exercise in order for weight loss, and to reduce the risk of further co-morbidity.  Exam stable. Labs pending.  Follow up in 1 year for repeat physical.

## 2024-09-30 NOTE — Assessment & Plan Note (Signed)
 Overall okay, but would not recommend labetalol  for ongoing treatment, she agrees.  Reduce labetalol  to 300 mg once daily x 1 week, then 150 mg x 1 week, then stopl  Start valsartan  80 mg daily. Follow up in 2 weeks.

## 2024-09-30 NOTE — Assessment & Plan Note (Signed)
 Overall stable.  Increase Topamax  to 100 mg HS. She will update.

## 2024-09-30 NOTE — Patient Instructions (Signed)
 Reduce labetalol  by taking 1 tablet once daily for 1 week, then take 1/2 tablet by mouth daily for 1 week, then stop.  Start valsartan  80 mg once daily for blood pressure.  Please schedule a follow up visit to meet back with me in 2-3 weeks for blood pressure check.   It was a pleasure to see you today!

## 2024-09-30 NOTE — Assessment & Plan Note (Signed)
 Controlled.  Increase Topamax  to 100 mg HS. Continue rizatriptan  5 mg as needed

## 2024-09-30 NOTE — Progress Notes (Signed)
 "  Subjective:    Patient ID: Rebecca Orozco, female    DOB: [birth year], 44 y.o.   MRN: [id number]  Rebecca Orozco is a very pleasant 44 y.o. female who presents today for complete physical and follow up of chronic conditions.  She is also here for follow up hypertension. Evaluated by Dr. Avelina on 08/26/24 for elevated blood pressure readings and migraine. Her labetalol  was increased to 300 mg BID. She's having a tough time taking her second dose. She does remember to take her second dose. She checks her BP at home which runs 130s systolic. Her migraines have improved, is managed on Topamax  50 mg HS. She would like a dose change to see if this helps with appetite.   Immunizations: -Tetanus: Completed in 2021 -Influenza: Declines influenza vaccine.  -HPV: Completed series   Diet: Fair diet.  Exercise: No regular exercise.  Eye exam: Completes annually  Dental exam: Completes semi-annually    Pap Smear: Completed > 3 years ago  Mammogram: Completed in September 2025   BP Readings from Last 3 Encounters:  09/30/24 132/82  08/26/24 (!) 146/100  11/08/23 124/76      Review of Systems  Constitutional:  Negative for unexpected weight change.  HENT:  Negative for rhinorrhea.   Respiratory:  Negative for cough and shortness of breath.   Cardiovascular:  Negative for chest pain.  Gastrointestinal:  Negative for constipation and diarrhea.  Genitourinary:  Negative for difficulty urinating.  Musculoskeletal:  Negative for arthralgias and myalgias.  Skin:  Negative for rash.  Allergic/Immunologic: Negative for environmental allergies.  Neurological:  Negative for dizziness and headaches.  Psychiatric/Behavioral:  The patient is not nervous/anxious.          Past Medical History:  Diagnosis Date   Acute maxillary sinusitis 11/27/2022   Anxiety and depression    Cervical dysplasia    Chronic hypertension affecting pregnancy 05/18/2020   Flu    flu meningitis as infant   History  of meningitis    HTN (hypertension)    Migraines    Pre-eclampsia superimposed on chronic hypertension 05/29/2020   Sinus pressure 08/10/2021   Vaginal Pap smear, abnormal     Social History   Socioeconomic History   Marital status: Married    Spouse name: Not on file   Number of children: Not on file   Years of education: Not on file   Highest education level: Not on file  Occupational History   Not on file  Tobacco Use   Smoking status: Never   Smokeless tobacco: Never   Tobacco comments:    tobacco use - no  Vaping Use   Vaping status: Never Used  Substance and Sexual Activity   Alcohol use: Not Currently    Alcohol/week: 0.0 standard drinks of alcohol   Drug use: No   Sexual activity: Not Currently  Other Topics Concern   Not on file  Social History Narrative   Full time teacher at [employer]; gets regular exercise.    Social Drivers of Health   Tobacco Use: Low Risk (09/30/2024)   Patient History    Smoking Tobacco Use: Never    Smokeless Tobacco Use: Never    Passive Exposure: Not on file  Financial Resource Strain: Not on file  Food Insecurity: Not on file  Transportation Needs: Not on file  Physical Activity: Not on file  Stress: Not on file  Social Connections: Not on file  Intimate Partner Violence: Not on file  Depression (EYV7-0): Low  Risk (09/30/2024)   Depression (PHQ2-9)    PHQ-2 Score: 3  Alcohol Screen: Not on file  Housing: Not on file  Utilities: Not on file  Health Literacy: Not on file    Past Surgical History:  Procedure Laterality Date   TONSILLECTOMY     VARICOSE VEIN SURGERY Right     Family History  Problem Relation Age of Onset   Colon cancer Mother 20   Hypertension Mother    Breast cancer Paternal Aunt        late 32s    Allergies[1]  Medications Ordered Prior to Encounter[2]  BP 132/82   Pulse 79   Temp 98.1 F (36.7 C) (Oral)   Ht 5' 4.75 (1.645 m)   Wt 225 lb 8 oz (102.3 kg)   SpO2 98%   BMI 37.82  kg/m  Objective:   Physical Exam Exam conducted with a chaperone present.  HENT:     Right Ear: Tympanic membrane and ear canal normal.     Left Ear: Tympanic membrane and ear canal normal.  Eyes:     Pupils: Pupils are equal, round, and reactive to light.  Cardiovascular:     Rate and Rhythm: Normal rate and regular rhythm.  Pulmonary:     Effort: Pulmonary effort is normal.     Breath sounds: Normal breath sounds.  Abdominal:     General: Bowel sounds are normal.     Palpations: Abdomen is soft.     Tenderness: There is no abdominal tenderness.  Genitourinary:    Labia:        Right: No rash, tenderness or lesion.        Left: No rash, tenderness or lesion.      Vagina: Normal.     Cervix: Normal.     Uterus: Normal.      Adnexa: Right adnexa normal and left adnexa normal.  Musculoskeletal:        General: Normal range of motion.     Cervical back: Neck supple.  Skin:    General: Skin is warm and dry.  Neurological:     Mental Status: She is alert and oriented to person, place, and time.     Cranial Nerves: No cranial nerve deficit.     Deep Tendon Reflexes:     Reflex Scores:      Patellar reflexes are 2+ on the right side and 2+ on the left side. Psychiatric:        Mood and Affect: Mood normal.     Physical Exam        Assessment & Plan:  Primary hypertension Assessment & Plan: Overall okay, but would not recommend labetalol  for ongoing treatment, she agrees.  Reduce labetalol  to 300 mg once daily x 1 week, then 150 mg x 1 week, then stopl  Start valsartan  80 mg daily. Follow up in 2 weeks.  Orders: -     Valsartan ; Take 1 tablet (80 mg total) by mouth daily. for blood pressure.  Dispense: 90 tablet; Refill: 0  Frequent headaches Assessment & Plan: Overall stable.  Increase Topamax  to 100 mg HS. She will update.  Orders: -     Topiramate ; Take 1 tablet (100 mg total) by mouth at bedtime. For headache prevention  Dispense: 90 tablet; Refill:  3  Migraine without aura and without status migrainosus, not intractable Assessment & Plan: Controlled.  Increase Topamax  to 100 mg HS. Continue rizatriptan  5 mg as needed   Screening for cervical cancer -  Cytology - PAP  Preventative health care Assessment & Plan: Declines influenza vaccine.  Pap smear due, completed today Mammogram due UTD  Discussed the importance of a healthy diet and regular exercise in order for weight loss, and to reduce the risk of further co-morbidity.  Exam stable. Labs pending.  Follow up in 1 year for repeat physical.      Assessment and Plan Assessment & Plan         Comer MARLA Gaskins, NP       [1]  Allergies Allergen Reactions   Amoxicillin -Pot Clavulanate Nausea And Vomiting   Latex Other (See Comments)   Propoxyphene Nausea And Vomiting   Doxycycline  Nausea And Vomiting  [2]  Current Outpatient Medications on File Prior to Visit  Medication Sig Dispense Refill   fluticasone  (FLONASE ) 50 MCG/ACT nasal spray Place 1-2 sprays into both nostrils daily as needed for allergies or rhinitis.     labetalol  (NORMODYNE ) 300 MG tablet Take 1 tablet (300 mg total) by mouth 2 (two) times daily. 60 tablet 1   levonorgestrel (MIRENA) 20 MCG/DAY IUD 1 each by Intrauterine route once.     rizatriptan  (MAXALT -MLT) 5 MG disintegrating tablet Take 1 tablet by mouth at migraine onset. May repeat in 2 hours if needed 10 tablet 0   sertraline (ZOLOFT) 100 MG tablet Take 100 mg by mouth daily.     traZODone (DESYREL) 100 MG tablet Take 100 mg by mouth at bedtime.     No current facility-administered medications on file prior to visit.   "

## 2024-10-01 LAB — CYTOLOGY - PAP
Comment: NEGATIVE
Diagnosis: NEGATIVE
High risk HPV: NEGATIVE

## 2024-10-02 ENCOUNTER — Ambulatory Visit: Payer: Self-pay | Admitting: Primary Care

## 2024-10-14 ENCOUNTER — Ambulatory Visit: Admitting: Primary Care

## 2024-10-23 ENCOUNTER — Ambulatory Visit: Admitting: Primary Care
